# Patient Record
Sex: Female | Born: 1970 | Race: White | Hispanic: No | Marital: Married | Smoking: Never smoker
Health system: Southern US, Community
[De-identification: ages and names within clinical notes are randomized; demographics above are authoritative.]

## PROBLEM LIST (undated history)

## (undated) DIAGNOSIS — M479 Spondylosis, unspecified: Secondary | ICD-10-CM

## (undated) DIAGNOSIS — D259 Leiomyoma of uterus, unspecified: Secondary | ICD-10-CM

## (undated) DIAGNOSIS — I10 Essential (primary) hypertension: Secondary | ICD-10-CM

## (undated) DIAGNOSIS — D509 Iron deficiency anemia, unspecified: Secondary | ICD-10-CM

## (undated) DIAGNOSIS — O3410 Maternal care for benign tumor of corpus uteri, unspecified trimester: Secondary | ICD-10-CM

## (undated) DIAGNOSIS — E785 Hyperlipidemia, unspecified: Secondary | ICD-10-CM

## (undated) HISTORY — PX: TUBAL LIGATION: SHX77

---

## 2006-07-28 ENCOUNTER — Ambulatory Visit: Payer: Self-pay | Admitting: Cardiology

## 2006-08-06 ENCOUNTER — Ambulatory Visit: Payer: Self-pay | Admitting: Cardiology

## 2006-08-31 ENCOUNTER — Ambulatory Visit: Payer: Self-pay | Admitting: Cardiology

## 2009-09-28 ENCOUNTER — Emergency Department (HOSPITAL_COMMUNITY): Admission: EM | Admit: 2009-09-28 | Discharge: 2009-09-28 | Payer: Self-pay | Admitting: Emergency Medicine

## 2011-01-17 NOTE — Assessment & Plan Note (Signed)
[facility]                          [name] NOTE   WAVE, CALZADA                        MRN:          [id number]  DATE:07/28/2006                            DOB:          1971-08-31    REFERRING PHYSICIAN:  Doreen Beam   PRIMARY CARDIOLOGIST:  Learta Codding, M.D. (new).   REASON FOR CONSULTATION:  Chest pain.   HISTORY OF PRESENT ILLNESS:  Ms. Verdell is a pleasant 40 year old  female, with no prior cardiac history, now referred for evaluation of  new onset chest pain.  The patient does have cardiac risk factors  notable for hypertension, initially diagnosed approximately three years  ago, for which she has only been on medication in the last year,  reported hyperlipidemia and a strong family history of premature  coronary artery disease.  The patient has no known history of diabetes  mellitus and has never smoked tobacco.   The patient had a recent 2-D echocardiogram which revealed normal left  ventricular function (ejection fraction of 60%), with mild left  ventricular dysfunction and mild mitral regurgitation.  No wall motion  abnormalities noted.   From a clinical standpoint, the patient describes intermittent chest  pains which have developed this year, which are quite unpredictable in  onset.  In fact, they are typically not related to activity and usually  occur while sitting watching TV or while lying in bed at night.  She  also reports having symptoms suggestive of reflux disease, for which she  is currently not on a medication.   These pains are described alternately as pins and needles, as well as  occasionally a heaviness sensation. They can last anywhere from a few  minutes to several hours in duration.  They are not exacerbated by deep  inspiration, movement of the chest or arms or with walking.  Of note,  she reports being able to climb a stairs with some associated dyspnea,  but no associated chest  discomfort.   An electrocardiogram today reveals a normal sinus rhythm at 92 b.p.m.  with normal axis, small Q-waves in the inferior lateral leads with  otherwise no ischemic changes.   ALLERGIES:  PENICILLIN.   CURRENT MEDICATIONS:  Lisinopril 20 mg q.d.   PAST MEDICAL HISTORY:  1. Hypertension.  2. Hyperlipidemia.  3. Obesity.  4. Status post tubal ligation.   SOCIAL HISTORY:  The patient is married.  They have an 53-year-old  daughter.  She has never smoked tobacco and denies alcohol or illicit  drug use.   FAMILY HISTORY:  Father age 68, status post myocardial infarction  approximately two years ago.  Mother age 48, status post recent  myocardial infarction.   REVIEW OF SYSTEMS:  Notable for occasional reflux.  A non-productive  cough.  Occasional dyspnea.  No PND, orthopnea or lower extremity edema.  Otherwise as noted per the HPI.  The remaining systems negative.   PHYSICAL EXAMINATION:  VITAL SIGNS:  Blood pressure 148/90, pulse 92 and  regular, weight 209 pounds.  GENERAL:  A 40 year old female, obese, sitting upright, in no apparent  distress.  HEENT:  Normocephalic and atraumatic.  NECK:  Palpable bilateral carotid pulses without bruits.  No jugular  venous distention at 90 degrees.  LUNGS:  Clear to auscultation in all fields.  HEART:  A regular rate and rhythm.  S1 and S2.  A soft grade 1-2/6 short  systolic ejection murmur heard loudest at the base.  No diastolic blow.  ABDOMEN:  Protuberant, nontender, with intact bowel sounds.  No bruits.  EXTREMITIES:  All palpable distal pulses with no pedal edema.  NEUROLOGIC:  No focal deficit.  BREASTS/GENITOURINARY/RECTAL:  Deferred.  NODES:  No lymphadenopathy.   IMPRESSION:  1. Atypical chest pain.  2. Multiple cardiac risk factors      a.     Hypertension.      b.     History of hyperlipidemia.      c.     Strong family history of premature coronary artery disease.      d.     Obesity.   PLAN:  I conferred  with Dr. Learta Codding and the recommendation is to  proceed with further evaluation with an exercise stress echocardiogram  test.  The patient presents with chest pain which is quite atypical, but  she does have several cardiac risk factors.  At the time of the  scheduled stress test, we will also check a fasting lipid profile for  reassessment of her lipid status.   Following completion of the study, we will have the patient return to  the clinic for a review of her test results and further recommendations.      Gene Serpe, PA-C  Electronically Signed      Learta Codding, MD,FACC  Electronically Signed   GS/MedQ  DD: 07/28/2006  DT: 07/28/2006  Job #: [id number]

## 2011-01-17 NOTE — Assessment & Plan Note (Signed)
[facility]                          [name]   Abigail Burgess, Abigail Burgess                        MRN:          [id number]  DATE:08/31/2006                            DOB:          1971-07-26    PRIMARY CARDIOLOGIST:  Dr. Lewayne Bunting.   REASON FOR OFFICE VISIT:  Scheduled 32-month followup.   The patient returns to discuss the results of a recent exercise stress  echocardiogram for further evaluation of chest pain, which was felt to  be atypical.  However, the patient was referred to [place] in the context of  multiple cardiac risk factors.   The patient completed an exercise stress echocardiogram on December 6,  during which he exercised for a little over 5 minutes, achieving 104% of  PMHR and 6.4 METS.  She had no chest pain, and there were no significant  EKG changes.  Echocardiography revealed normal hyperdynamic contracture  with exercise with no wall motion abnormalities.   Clinically, the patient reports no further chest pain.   We also discussed the results of a lipid profile, which we ordered.  This revealed a total cholesterol of 222 with an HDL of 53,  triglycerides of 58, and an LDL of 157.   CURRENT MEDICATIONS:  Lisinopril 20 daily.   PHYSICAL EXAMINATION:  Blood pressure 140/90, pulse 80 and regular,  weight 208.  GENERAL:  A 40 year old female sitting upright in no distress.  NECK:  Palpable carotid pulses; no JVD.  LUNGS:  Clear to auscultation.  HEART:  Regular rate and rhythm (S1, S2).  No significant murmurs.  EXTREMITIES:  No edema.  NEURO:  No focal deficits.   IMPRESSION:  Abigail Burgess is a 40 year old female who is recently  referred to [place] for evaluation of atypical chest pain in the setting of  multiple cardiac risk factors, and who had a normal exercise stress  echocardiogram earlier this month.  She is currently having no further  symptoms and no further cardiac workup is, therefore, recommended at  this  point in time.   The patient is to continue on current medication regimen.  We also  discussed the importance of diet/exercise as initial treatment of her  hyperlipidemia.  She should have a repeat lipid profile in 6 months, and  if total cholesterol is still elevated, she is to discuss whether or not  to initiate medical therapy with Dr. Sherril Croon.   The patient will otherwise return to Dr. Lewayne Bunting on an as needed  basis.      Gene Serpe, PA-C  Electronically Signed      Learta Codding, MD,FACC  Electronically Signed   GS/MedQ  DD: 08/31/2006  DT: 08/31/2006  Job #: [id number]   cc:   Doreen Beam

## 2015-07-03 DIAGNOSIS — I10 Essential (primary) hypertension: Secondary | ICD-10-CM

## 2015-07-03 DIAGNOSIS — M479 Spondylosis, unspecified: Secondary | ICD-10-CM

## 2015-07-03 DIAGNOSIS — E785 Hyperlipidemia, unspecified: Secondary | ICD-10-CM

## 2015-07-03 HISTORY — DX: Hyperlipidemia, unspecified: E78.5

## 2015-07-03 HISTORY — DX: Spondylosis, unspecified: M47.9

## 2015-07-03 HISTORY — DX: Essential (primary) hypertension: I10

## 2015-07-19 ENCOUNTER — Telehealth: Payer: Self-pay | Admitting: Internal Medicine

## 2015-07-19 NOTE — Telephone Encounter (Signed)
Transfer from [facility] per EDP,  Dr. Jake Samples  44 year old lady with past medical history of hypertension and allergy, who presented to the [facility] emergency room with intractable nausea, vomiting and diarrhea for 7 hours. Patient vomited more than 10 times and had watery diarrhea for more than 15 times. No hematochezia or hematemesis. No abdominal pain. No recent antibiotics use. Her nausea and vomiting have improved after treated with Zofran 2, Phenergan 1 and reglan x 1 in ED. Patient received 2 L normal saline bolus and followed by 100 mL of normal saline. Rocephin was started for possible UTI  Patient was found to have positive urinalysis with trace amount of leukocyte and large amount of RBC and also some trichomonas. Electrolytes and renal function okay except for bicarbonate 20.5, liver function normal, lipase 14, WBC 6.4, hemoglobin 12.3, platelets 388. CT head is negative for acute intracranial abnormalities. Blood pressure 144/83, temperature normal, heart rate at ~90s and RR 20.   Family stronely requested patient being transferred to [facility] for further evaluation and treatment. Accepted to Tele bed.  Ivor Costa, MD  [facility] Pager [phone]  If 7PM-7AM, please contact night-coverage [email or web address] Password [facility] 07/19/2015, 7:56 PM

## 2015-07-20 ENCOUNTER — Observation Stay (HOSPITAL_BASED_OUTPATIENT_CLINIC_OR_DEPARTMENT_OTHER): Payer: Medicaid Other

## 2015-07-20 ENCOUNTER — Inpatient Hospital Stay (HOSPITAL_COMMUNITY)
Admission: AD | Admit: 2015-07-20 | Discharge: 2015-07-27 | DRG: 392 | Disposition: A | Discharge status: Home / Self Care | Payer: Medicaid Other | Source: Other Acute Inpatient Hospital | Attending: Internal Medicine | Admitting: Internal Medicine

## 2015-07-20 DIAGNOSIS — E876 Hypokalemia: Secondary | ICD-10-CM | POA: Diagnosis present

## 2015-07-20 DIAGNOSIS — D259 Leiomyoma of uterus, unspecified: Secondary | ICD-10-CM | POA: Diagnosis present

## 2015-07-20 DIAGNOSIS — K529 Noninfective gastroenteritis and colitis, unspecified: Secondary | ICD-10-CM | POA: Diagnosis not present

## 2015-07-20 DIAGNOSIS — E079 Disorder of thyroid, unspecified: Secondary | ICD-10-CM | POA: Diagnosis present

## 2015-07-20 DIAGNOSIS — R011 Cardiac murmur, unspecified: Secondary | ICD-10-CM

## 2015-07-20 DIAGNOSIS — R748 Abnormal levels of other serum enzymes: Secondary | ICD-10-CM | POA: Diagnosis present

## 2015-07-20 DIAGNOSIS — E785 Hyperlipidemia, unspecified: Secondary | ICD-10-CM | POA: Diagnosis present

## 2015-07-20 DIAGNOSIS — A5901 Trichomonal vulvovaginitis: Secondary | ICD-10-CM | POA: Diagnosis present

## 2015-07-20 DIAGNOSIS — A084 Viral intestinal infection, unspecified: Principal | ICD-10-CM | POA: Diagnosis present

## 2015-07-20 DIAGNOSIS — R1110 Vomiting, unspecified: Secondary | ICD-10-CM | POA: Insufficient documentation

## 2015-07-20 DIAGNOSIS — E860 Dehydration: Secondary | ICD-10-CM | POA: Diagnosis present

## 2015-07-20 DIAGNOSIS — M545 Low back pain: Secondary | ICD-10-CM | POA: Diagnosis present

## 2015-07-20 DIAGNOSIS — A09 Infectious gastroenteritis and colitis, unspecified: Secondary | ICD-10-CM | POA: Insufficient documentation

## 2015-07-20 DIAGNOSIS — R000 Tachycardia, unspecified: Secondary | ICD-10-CM | POA: Diagnosis present

## 2015-07-20 DIAGNOSIS — I10 Essential (primary) hypertension: Secondary | ICD-10-CM | POA: Diagnosis present

## 2015-07-20 DIAGNOSIS — A599 Trichomoniasis, unspecified: Secondary | ICD-10-CM | POA: Diagnosis present

## 2015-07-20 DIAGNOSIS — M5489 Other dorsalgia: Secondary | ICD-10-CM | POA: Diagnosis not present

## 2015-07-20 DIAGNOSIS — R109 Unspecified abdominal pain: Secondary | ICD-10-CM | POA: Insufficient documentation

## 2015-07-20 DIAGNOSIS — R1084 Generalized abdominal pain: Secondary | ICD-10-CM | POA: Diagnosis not present

## 2015-07-20 DIAGNOSIS — M549 Dorsalgia, unspecified: Secondary | ICD-10-CM

## 2015-07-20 HISTORY — DX: Hyperlipidemia, unspecified: E78.5

## 2015-07-20 HISTORY — DX: Iron deficiency anemia, unspecified: D50.9

## 2015-07-20 HISTORY — DX: Essential (primary) hypertension: I10

## 2015-07-20 HISTORY — DX: Maternal care for benign tumor of corpus uteri, unspecified trimester: O34.10

## 2015-07-20 HISTORY — DX: Spondylosis, unspecified: M47.9

## 2015-07-20 HISTORY — DX: Leiomyoma of uterus, unspecified: D25.9

## 2015-07-20 LAB — COMPREHENSIVE METABOLIC PANEL
ALT: 12 U/L — ABNORMAL LOW (ref 14–54)
ANION GAP: 6 (ref 5–15)
AST: 18 U/L (ref 15–41)
Albumin: 2.8 g/dL — ABNORMAL LOW (ref 3.5–5.0)
Alkaline Phosphatase: 78 U/L (ref 38–126)
BUN: <5 mg/dL — ABNORMAL LOW (ref 6–20)
CHLORIDE: 107 mmol/L (ref 101–111)
CO2: 25 mmol/L (ref 22–32)
CREATININE: 0.7 mg/dL (ref 0.44–1.00)
Calcium: 8.1 mg/dL — ABNORMAL LOW (ref 8.9–10.3)
Glucose, Bld: 117 mg/dL — ABNORMAL HIGH (ref 65–99)
POTASSIUM: 2.8 mmol/L — AB (ref 3.5–5.1)
SODIUM: 138 mmol/L (ref 135–145)
Total Bilirubin: 0.4 mg/dL (ref 0.3–1.2)
Total Protein: 6 g/dL — ABNORMAL LOW (ref 6.5–8.1)

## 2015-07-20 LAB — CBC
HEMATOCRIT: 32.8 % — AB (ref 36.0–46.0)
HEMOGLOBIN: 10.6 g/dL — AB (ref 12.0–15.0)
MCH: 25.2 pg — ABNORMAL LOW (ref 26.0–34.0)
MCHC: 32.3 g/dL (ref 30.0–36.0)
MCV: 77.9 fL — AB (ref 78.0–100.0)
PLATELETS: 317 10*3/uL (ref 150–400)
RBC: 4.21 MIL/uL (ref 3.87–5.11)
RDW: 14.6 % (ref 11.5–15.5)
WBC: 4.7 10*3/uL (ref 4.0–10.5)

## 2015-07-20 MED ORDER — CIPROFLOXACIN IN D5W 400 MG/200ML IV SOLN
400.0000 mg | Freq: Two times a day (BID) | INTRAVENOUS | Status: DC
  Administered 2015-07-20: 400 mg via INTRAVENOUS
  Filled 2015-07-20: qty 200

## 2015-07-20 MED ORDER — HYDROCHLOROTHIAZIDE 25 MG PO TABS: 25.0000 mg | ORAL_TABLET | Freq: Every day | ORAL | Status: DC

## 2015-07-20 MED ORDER — DEXTROSE 5 % IV SOLN: 1.0000 g | INTRAVENOUS | Status: DC

## 2015-07-20 MED ORDER — SODIUM CHLORIDE 0.9 % IV BOLUS (SEPSIS)
1000.0000 mL | Freq: Once | INTRAVENOUS | Status: AC
  Administered 2015-07-20: 1000 mL via INTRAVENOUS

## 2015-07-20 MED ORDER — ONDANSETRON HCL 4 MG PO TABS: 4.0000 mg | ORAL_TABLET | Freq: Four times a day (QID) | ORAL | Status: DC | PRN

## 2015-07-20 MED ORDER — LISINOPRIL 40 MG PO TABS: 40.0000 mg | ORAL_TABLET | Freq: Every day | ORAL | Status: DC

## 2015-07-20 MED ORDER — TINIDAZOLE 500 MG PO TABS: 2.0000 g | ORAL_TABLET | Freq: Once | ORAL | Status: AC

## 2015-07-20 MED ORDER — POTASSIUM CHLORIDE IN NACL 20-0.9 MEQ/L-% IV SOLN
INTRAVENOUS | Status: DC
  Administered 2015-07-20 – 2015-07-27 (×17): via INTRAVENOUS
  Filled 2015-07-20 (×32): qty 1000

## 2015-07-20 MED ORDER — PROMETHAZINE HCL 25 MG/ML IJ SOLN
25.0000 mg | Freq: Four times a day (QID) | INTRAMUSCULAR | Status: DC | PRN
  Administered 2015-07-20 – 2015-07-25 (×11): 25 mg via INTRAVENOUS
  Filled 2015-07-20 (×11): qty 1

## 2015-07-20 MED ORDER — GI COCKTAIL ~~LOC~~
30.0000 mL | Freq: Once | ORAL | Status: AC
  Administered 2015-07-20: 30 mL via ORAL
  Filled 2015-07-20: qty 30

## 2015-07-20 MED ORDER — HYDRALAZINE HCL 20 MG/ML IJ SOLN
10.0000 mg | Freq: Four times a day (QID) | INTRAMUSCULAR | Status: DC | PRN
  Administered 2015-07-20 – 2015-07-21 (×3): 10 mg via INTRAVENOUS
  Filled 2015-07-20 (×3): qty 1

## 2015-07-20 MED ORDER — LISINOPRIL 40 MG PO TABS
40.0000 mg | ORAL_TABLET | Freq: Every day | ORAL | Status: DC
  Administered 2015-07-22 – 2015-07-27 (×6): 40 mg via ORAL
  Filled 2015-07-20 (×6): qty 1

## 2015-07-20 MED ORDER — METRONIDAZOLE IN NACL 5-0.79 MG/ML-% IV SOLN
500.0000 mg | Freq: Three times a day (TID) | INTRAVENOUS | Status: DC
  Administered 2015-07-20 (×2): 500 mg via INTRAVENOUS
  Filled 2015-07-20 (×2): qty 100

## 2015-07-20 MED ORDER — MAGNESIUM SULFATE 2 GM/50ML IV SOLN
2.0000 g | Freq: Once | INTRAVENOUS | Status: AC
  Administered 2015-07-20: 2 g via INTRAVENOUS
  Filled 2015-07-20: qty 50

## 2015-07-20 MED ORDER — POTASSIUM CHLORIDE CRYS ER 20 MEQ PO TBCR
40.0000 meq | EXTENDED_RELEASE_TABLET | ORAL | Status: AC
  Administered 2015-07-20 (×2): 40 meq via ORAL
  Filled 2015-07-20 (×2): qty 2

## 2015-07-20 MED ORDER — HYDRALAZINE HCL 20 MG/ML IJ SOLN
10.0000 mg | Freq: Once | INTRAMUSCULAR | Status: AC
  Administered 2015-07-20: 10 mg via INTRAVENOUS
  Filled 2015-07-20: qty 1

## 2015-07-20 MED ORDER — PANTOPRAZOLE SODIUM 40 MG IV SOLR
40.0000 mg | Freq: Once | INTRAVENOUS | Status: AC
  Administered 2015-07-20: 40 mg via INTRAVENOUS
  Filled 2015-07-20: qty 40

## 2015-07-20 MED ORDER — ONDANSETRON HCL 4 MG/2ML IJ SOLN
4.0000 mg | Freq: Four times a day (QID) | INTRAMUSCULAR | Status: DC | PRN
  Administered 2015-07-20 – 2015-07-25 (×10): 4 mg via INTRAVENOUS
  Filled 2015-07-20 (×11): qty 2

## 2015-07-20 NOTE — Care Management Note (Signed)
Case Management Note  Patient Details  Name: Abigail Burgess MRN: [id number] Date of Birth: 29-Jan-1971  Subjective/Objective:                  Date-07-20-15 Initial Assessment Spoke with patient at the bedside Introduced self as case manager and explained role in discharge planning and how to be reached.  Verified patient lives with boyfriend in [place] Verified patient anticipates to go home with boyfriend.  Patient has no DME. Expressed potential need for no other DME.  Patient denied  needing help with their medication.  Patient drives to MD appointments.  Verified patient has PCP   Plan: CM will continue to follow for discharge planning and [facility] resources.   Carles Collet RN BSN CM [phone]   Action/Plan:   Expected Discharge Date:                  Expected Discharge Plan:  Home/Self Care  In-House Referral:     Discharge planning Services  CM Consult  Post Acute Care Choice:    Choice offered to:     DME Arranged:    DME Agency:     HH Arranged:    [place] Agency:     Status of Service:  Completed, signed off  Medicare Important Message Given:    Date Medicare IM Given:    Medicare IM give by:    Date Additional Medicare IM Given:    Additional Medicare Important Message give by:     If discussed at [place] of Stay Meetings, dates discussed:    Additional Comments:  Carles Collet, RN 07/20/2015, 11:56 AM

## 2015-07-20 NOTE — Progress Notes (Signed)
Pharmacist Provided - Patient Medication Education Prior to Discharge   Abigail Burgess is an 44 y.o. female who presented to [facility] on 07/20/2015 with a chief complaint of No chief complaint on file.    [x]  Patient will be discharged with new medications []  Patient being discharged without any new medications  The following medications were discussed with the patient:   Pain Control medications: []  Yes    [x]  No  Diabetes Medications: []  Yes    [x]  No  Heart Failure Medications: []  Yes    [x]  No  Blood pressure Medications:  [x]  Yes    []  No  Antibiotics at discharge: [x]  Yes    []  No  Allergy Assessment Completed and Updated: [x]  Yes    []  No Identified Patient Allergies:  Allergies  Allergen Reactions  . Bee Pollen Anaphylaxis  . Ciprofloxacin Hives     Medication Adherence Assessment: []  Excellent (no doses missed/week)      [x]  Good (1 dose missed/week)      []  Partial (2-3 doses missed/week)      []  Poor (>3 doses missed/week)  Barriers to Obtaining Medications: []  Yes [x]  No  Abigail Burgess expressed no concern with obtaining medication.  Assessment: 44 y/o F admitted with vomiting and diarrhea. Patient will be going home on new medications HCTZ and tinidazole. Counseled patient on new blood pressure medications, side effects, and appropriate use. Pt has a blood pressure cuff at home and endorses regularly checking blood pressure. Also counseled patient on one time dose of tinidazole. Pt verbalized understanding of treatment plan.   Time spent preparing for discharge counseling: 10 min Time spent counseling patient: 10 min  Angela Burke, PharmD Pharmacy Resident Pager: [phone] 07/20/2015, 3:18 PM

## 2015-07-20 NOTE — H&P (Signed)
[facility] History and Physical  BRYTTNEY AUGUST [id number] DOB: 1971-08-23 DOA: 07/20/2015  Referring physician: Dr. Jake Samples. PCP: Glenda Chroman., MD   Chief Complaint: Nausea, vomiting and diarrhea.  HPI: Abigail Burgess is a 44 y.o. female with a past medical history of hypertension, hyperlipidemia, unspecified thyroid disease who was transferred to this facility after presenting to [facility] with complaints of having emesis more than 10 times and mucosa watery stools more than 15 times since earlier in the day. Per patient, her last meal was a combo meal from [employer] Wednesday evening. She states that she went to bed early at around 9 like usual and did not have any symptoms until the morning. She denies melena or hematochezia. She denies urinary symptoms. She says that to her knowledge, there are no other persons with similar symptoms.  [facility] significant for decreased CO2 at 20.5 and an elevated anion gap at 21. Her UA shows mild pyuria, bacteriuria and Trichomonas. I was unable to discuss this last issue with the patient due to her persistent somnolence. However the patient stated the she was in no distress, feeling better, that she was tired and used to go to bed early in the evening.    Review of Systems:  Constitutional:  Positive chills, fatigue.  No weight loss, night sweats, Fevers,  HEENT:  No headaches, Difficulty swallowing,Tooth/dental problems,Sore throat,  No sneezing, itching, ear ache, nasal congestion, post nasal drip,  Cardio-vascular:  Frequent lower extremity pitting edema and palpitations. No chest pain, Orthopnea, PND, anasarca, dizziness. GI:  Positive abdominal pain, nausea and emesis and diarrhea. No melena, no hematochezia. Resp:  No dyspnea, productive cough, hemoptysis or wheezing. Skin:  no rash or lesions.  GU:  no dysuria, change in color of urine, no urgency or frequency. No flank pain.  Musculoskeletal:  No  joint pain or swelling. No decreased range of motion. No back pain.  Psych:  No change in mood or affect. No depression or anxiety. No memory loss.   No past medical history on file. No past surgical history on file. Social History:  has no tobacco, alcohol, and drug history on file.  Allergies not on file  No family history on file.   Prior to Admission medications   Not on File   Physical Exam: Filed Vitals:   07/20/15 0039 07/20/15 0043 07/20/15 0216  BP:  145/84   Pulse:  109   Temp:  98.6 F (37 C)   TempSrc:  Oral   Resp:  24   Height: 5' (1.524 m)    Weight: 89.086 kg (196 lb 6.4 oz)    SpO2:  99% 99%    Wt Readings from Last 3 Encounters:  07/20/15 89.086 kg (196 lb 6.4 oz)    General:  Appears calm and comfortable Eyes: PERRL, normal lids, irises & conjunctiva ENT: grossly normal hearing, lips and oral mucosa are dry. Neck: no LAD, masses or thyromegaly Cardiovascular: Tachycardic and positive systolic murmur. No LE edema. Telemetry: Tachycardic  Respiratory: CTA bilaterally, no w/r/r. Normal respiratory effort. Abdomen: Bowel sounds hyperactive, soft, positive left lower quadrant and epigastric tenderness. There is no guarding or rebound tenderness. Skin: no rash or induration seen on limited exam Musculoskeletal: grossly normal tone BUE/BLE Psychiatric: Somnolent, but arousable and answers questions properly. Neurologic: grossly non-focal.           EKG: Independently reviewed.  Sinus tachycardia at 111 bpm Probable left heart enlargement Nonspecific T wave abnormality Prolonged QT.  Assessment/Plan Principal Problem:   Acute gastroenteritis Continue IV hydration and IV antibiotics. Treat abdominal pain, nausea and emesis symptoms as needed.  Active Problems:   Tachycardia Continue IV hydration, recheck potassium and check magnesium level.    Heart murmur Per patient, she gets frequent lower extremity edema. Check echocardiogram.     Trichomonas infection Unable to discuss this issue with the patient due to her tiredness, fatigue and lack of privacy.  Continue IV Flagyl.    Dehydration Continue IV hydration.    Essential hypertension Resume lisinopril once the patient is tolerating oral intake.    Hyperlipidemia Not currently on medication.    Code Status: Full code. DVT Prophylaxis: SCDs. Family Communication: Disposition Plan: Admit to telemetry and continue IV hydration/IV antibiotics.  Time spent: Over 70 minutes were spent during the process of this admission.  Reubin Milan [facility] Pager [phone].

## 2015-07-20 NOTE — Discharge Summary (Signed)
Physician Discharge Summary  TOPAZ ELDRETH [id number] DOB: 08/18/1971 DOA: 07/20/2015  PCP: Glenda Chroman., MD  Admit date: 07/20/2015 Discharge date: 07/20/2015  Time spent: 25 minutes  Recommendations for Outpatient Follow-up:  1. Discharged home with outpatient PCP follow-up   Discharge Diagnoses:  Principal Problem:   Acute gastroenteritis  Active Problems:   Tachycardia   Heart murmur   Trichomonas infection   Dehydration   Essential hypertension   Hyperlipidemia   Hypokalemia   Discharge Condition: Fair  Diet recommendation: Low sodium  Filed Weights   07/20/15 0039  Weight: 89.086 kg (196 lb 6.4 oz)    History of present illness:  Please refer to admission H&P for details, in brief, 44 year old female with history of hypertension, hyperlipidemia, presented to [facility] hospital after having >10 episodes of vomiting with watery stool  on the morning of 11/17. She reports having abnormal meal at [employer] on the evening prior. She denies any fevers, chills, headache, dizziness, chest pain, palpitations, shortness of breath, abdominal pain, hematemesis or melena. Denied any urinary symptoms. Workup at [facility] showed mild metabolic acidosis with anion gap of 17. UA showed bacteria and trichomonas. Patient reportedly was sleepy and lethargic at [facility]. Patient was given several rounds of antiemetics and IV fluids. Vitals were stable except for being tachycardic. She had normal white counts and lactic acid was normal. As per Patient and family request she was transferred to [facility].  Hospital Course:  Acute gastroenteritis Possibly associated with food poisoning. Symptoms resolved with IV hydration, antiemetics. Patient was placed on empiric Cipro and Flagyl. Diet advanced to regular and patient tolerating well.  Tachycardia Associated with severe dehydration. Asymptomatic. 2-D echo done with EF of 55 and 62% and grade 1 diastolic  dysfunction. No valvular abnormality noted.  Hypokalemia Replenish with by mouth and IV KCl. Will also order 2g IV magnesium sulfate.  Trichomonas vaginitis Patient reports having multiple partners over the past 2 years. Denies any urinary symptoms. Reports having a single partner for the past 1 year . Will treat her with a single dose of Tinizadole 2 gm.  Have instructed to have a partner checked for trichomonas, STD and HIV.  Essential hypertension Resume lisinopril. Have instructed to hold HCTZ for next 2 days given severe hypokalemia.  Patient is clinically stable and can be discharged home.   Procedures:  NONE  Consultations:  none  Discharge Exam: Filed Vitals:   07/20/15 0507  BP: 155/86  Pulse: 121  Temp: 99.4 F (37.4 C)  Resp: 16    General: Middle aged female not in distress HEENT: No pallor, moist oral mucosa  chest: Clear to auscultation bilaterally CVS: S1 and S2 tachycardic, no murmurs or gallop GI: Soft, nondistended, nontender Musculoskeletal: Warm, no edema  Discharge Instructions    Current Discharge Medication List    START taking these medications   Details  hydrochlorothiazide (HYDRODIURIL) 25 MG tablet Take 1 tablet (25 mg total) by mouth daily. Qty: 10 tablet, Refills: 0    lisinopril (PRINIVIL,ZESTRIL) 40 MG tablet Take 1 tablet (40 mg total) by mouth daily. Qty: 10 tablet, Refills: 0    tinidazole (TINDAMAX) 500 MG tablet Take 4 tablets (2,000 mg total) by mouth once. Qty: 4 tablet, Refills: 0       Allergies  Allergen Reactions  . Bee Pollen Swelling  . Ciprofloxacin Hives   Follow-up Information    Follow up with [name],[name], MD. Schedule an appointment as soon as possible for a visit in  2 weeks.   Specialty:  Internal Medicine   Contact information:   [address] [place] [phone]        The results of significant diagnostics from this hospitalization (including imaging, microbiology, ancillary and  laboratory) are listed below for reference.    Significant Diagnostic Studies: No results found.  Microbiology: No results found for this or any previous visit (from the past 240 hour(s)).   Labs: Basic Metabolic Panel:  Recent Labs Lab 07/20/15 0807  NA 138  K 2.8*  CL 107  CO2 25  GLUCOSE 117*  BUN <5*  CREATININE 0.70  CALCIUM 8.1*   Liver Function Tests:  Recent Labs Lab 07/20/15 0807  AST 18  ALT 12*  ALKPHOS 78  BILITOT 0.4  PROT 6.0*  ALBUMIN 2.8*   No results for input(s): LIPASE, AMYLASE in the last 168 hours. No results for input(s): AMMONIA in the last 168 hours. CBC:  Recent Labs Lab 07/20/15 0807  WBC 4.7  HGB 10.6*  HCT 32.8*  MCV 77.9*  PLT 317   Cardiac Enzymes: No results for input(s): CKTOTAL, CKMB, CKMBINDEX, TROPONINI in the last 168 hours. BNP: BNP (last 3 results) No results for input(s): BNP in the last 8760 hours.  ProBNP (last 3 results) No results for input(s): PROBNP in the last 8760 hours.  CBG: No results for input(s): GLUCAP in the last 168 hours.     SignedLouellen Molder  [facility] 07/20/2015, 10:14 AM

## 2015-07-20 NOTE — Progress Notes (Signed)
  Echocardiogram 2D Echocardiogram has been performed.  Abigail Burgess 07/20/2015, 9:51 AM

## 2015-07-21 LAB — COMPREHENSIVE METABOLIC PANEL
ALT: 13 U/L — AB (ref 14–54)
AST: 19 U/L (ref 15–41)
Albumin: 3.4 g/dL — ABNORMAL LOW (ref 3.5–5.0)
Alkaline Phosphatase: 92 U/L (ref 38–126)
Anion gap: 10 (ref 5–15)
BUN: <5 mg/dL — ABNORMAL LOW (ref 6–20)
CHLORIDE: 107 mmol/L (ref 101–111)
CO2: 22 mmol/L (ref 22–32)
Calcium: 8.8 mg/dL — ABNORMAL LOW (ref 8.9–10.3)
Creatinine, Ser: 0.77 mg/dL (ref 0.44–1.00)
Glucose, Bld: 122 mg/dL — ABNORMAL HIGH (ref 65–99)
POTASSIUM: 3.6 mmol/L (ref 3.5–5.1)
SODIUM: 139 mmol/L (ref 135–145)
Total Bilirubin: 0.5 mg/dL (ref 0.3–1.2)
Total Protein: 6.9 g/dL (ref 6.5–8.1)

## 2015-07-21 LAB — CBC WITH DIFFERENTIAL/PLATELET
BASOS PCT: 0 %
Basophils Absolute: 0 10*3/uL (ref 0.0–0.1)
EOS PCT: 0 %
Eosinophils Absolute: 0 10*3/uL (ref 0.0–0.7)
HEMATOCRIT: 37.6 % (ref 36.0–46.0)
HEMOGLOBIN: 12.7 g/dL (ref 12.0–15.0)
Lymphocytes Relative: 13 %
Lymphs Abs: 1.1 10*3/uL (ref 0.7–4.0)
MCH: 26.1 pg (ref 26.0–34.0)
MCHC: 33.8 g/dL (ref 30.0–36.0)
MCV: 77.4 fL — AB (ref 78.0–100.0)
MONOS PCT: 5 %
Monocytes Absolute: 0.4 10*3/uL (ref 0.1–1.0)
NEUTROS PCT: 82 %
Neutro Abs: 6.7 10*3/uL (ref 1.7–7.7)
PLATELETS: 377 10*3/uL (ref 150–400)
RBC: 4.86 MIL/uL (ref 3.87–5.11)
RDW: 14.8 % (ref 11.5–15.5)
WBC: 8.2 10*3/uL (ref 4.0–10.5)

## 2015-07-21 MED ORDER — LORAZEPAM 2 MG/ML IJ SOLN
1.0000 mg | Freq: Once | INTRAMUSCULAR | Status: AC
  Administered 2015-07-21: 1 mg via INTRAVENOUS
  Filled 2015-07-21: qty 1

## 2015-07-21 MED ORDER — GI COCKTAIL ~~LOC~~
30.0000 mL | Freq: Three times a day (TID) | ORAL | Status: DC | PRN
  Administered 2015-07-23: 30 mL via ORAL
  Filled 2015-07-21 (×2): qty 30

## 2015-07-21 MED ORDER — MORPHINE SULFATE (PF) 2 MG/ML IV SOLN
2.0000 mg | INTRAVENOUS | Status: DC | PRN
  Administered 2015-07-21 – 2015-07-25 (×12): 2 mg via INTRAVENOUS
  Filled 2015-07-21 (×12): qty 1

## 2015-07-21 MED ORDER — HYDRALAZINE HCL 20 MG/ML IJ SOLN
10.0000 mg | INTRAMUSCULAR | Status: DC | PRN
  Administered 2015-07-21 – 2015-07-24 (×6): 10 mg via INTRAVENOUS
  Filled 2015-07-21 (×7): qty 1

## 2015-07-21 MED ORDER — OXYCODONE HCL 5 MG PO TABS
5.0000 mg | ORAL_TABLET | ORAL | Status: DC | PRN
  Administered 2015-07-21 – 2015-07-22 (×2): 5 mg via ORAL
  Filled 2015-07-21 (×2): qty 1

## 2015-07-21 MED ORDER — TRAMADOL HCL 50 MG PO TABS: 50.0000 mg | ORAL_TABLET | Freq: Four times a day (QID) | ORAL | Status: DC | PRN

## 2015-07-21 MED ORDER — METOCLOPRAMIDE HCL 5 MG/ML IJ SOLN
10.0000 mg | Freq: Four times a day (QID) | INTRAMUSCULAR | Status: DC
  Administered 2015-07-21 – 2015-07-27 (×21): 10 mg via INTRAVENOUS
  Filled 2015-07-21 (×23): qty 2

## 2015-07-21 MED ORDER — FAMOTIDINE IN NACL 20-0.9 MG/50ML-% IV SOLN
20.0000 mg | Freq: Two times a day (BID) | INTRAVENOUS | Status: DC
  Administered 2015-07-21 – 2015-07-23 (×5): 20 mg via INTRAVENOUS
  Filled 2015-07-21 (×8): qty 50

## 2015-07-21 NOTE — Progress Notes (Signed)
[facility] PROGRESS NOTE  Abigail Burgess [id number] DOB: 1971/05/09 DOA: 07/20/2015 PCP: Glenda Chroman., MD  Assessment/Plan: Acute gastroenteritis Possibly associated with food poisoning.  Symptoms had resolved was plan to be discharged home yesterday but again reoccurred several episodes of vomiting and nonbloody diarrhea. Will check stool for GI pathogen panel. Strongly suspect C. difficile. We'll place her on scheduled Reglan with when necessary Phenergan. Continue IV hydration.  Tachycardia Associated with severe dehydration. Asymptomatic. 2-D echo done with EF of 55 and 62% and grade 1 diastolic dysfunction. No valvular abnormality noted.  Hypokalemia Replenished.  Trichomonas vaginitis Patient reports having multiple partners over the past 2 years. Denies any urinary symptoms. Reports having a single partner for the past 1 year . Will treat her with a single dose of Tinizadole 2 gm upon discharge.. Have instructed to have a partner checked for trichomonas, STD and HIV.  Essential hypertension Resume lisinopril. Blood pressure elevated possibly associated with her current symptoms. Place on when necessary hydralazine. Have instructed to hold HCTZ for next 2 days given severe hypokalemia.     Diet: Clears as tolerated  Family communication: None at bedside Disposition: Home once symptoms improve. Consultants:  None  Procedures:  None  Antibiotics:  None  HPI/Subjective: Had multiple episodes of vomiting followed by watery diarrhea since yesterday afternoon. Afebrile. Complains of some abdominal discomfort with vomiting. No hematemesis or melena.  Objective: Filed Vitals:   07/21/15 0415  BP: 161/94  Pulse: 107  Temp:   Resp:     Intake/Output Summary (Last 24 hours) at 07/21/15 1117 Last data filed at 07/21/15 [id number]  Gross per 24 hour  Intake 3099.16 ml  Output   2406 ml  Net 693.16 ml   Filed Weights   07/20/15 0039  Weight: 89.086 kg  (196 lb 6.4 oz)    Exam:   General:  He lives female lying in bed appears fatigued  HEENT: No pallor, dry oral mucosa, supple neck   chest: Clear to auscultation bilaterally  CVS: Normal S1 and S2, no murmurs  GI: Soft, nondistended, nontender, hyperactive bowel sounds  Musculoskeletal: Warm, no edema     Data Reviewed: Basic Metabolic Panel:  Recent Labs Lab 07/20/15 0807 07/21/15 0440  NA 138 139  K 2.8* 3.6  CL 107 107  CO2 25 22  GLUCOSE 117* 122*  BUN <5* <5*  CREATININE 0.70 0.77  CALCIUM 8.1* 8.8*   Liver Function Tests:  Recent Labs Lab 07/20/15 0807 07/21/15 0440  AST 18 19  ALT 12* 13*  ALKPHOS 78 92  BILITOT 0.4 0.5  PROT 6.0* 6.9  ALBUMIN 2.8* 3.4*   No results for input(s): LIPASE, AMYLASE in the last 168 hours. No results for input(s): AMMONIA in the last 168 hours. CBC:  Recent Labs Lab 07/20/15 0807 07/21/15 0658  WBC 4.7 8.2  NEUTROABS  --  6.7  HGB 10.6* 12.7  HCT 32.8* 37.6  MCV 77.9* 77.4*  PLT 317 377   Cardiac Enzymes: No results for input(s): CKTOTAL, CKMB, CKMBINDEX, TROPONINI in the last 168 hours. BNP (last 3 results) No results for input(s): BNP in the last 8760 hours.  ProBNP (last 3 results) No results for input(s): PROBNP in the last 8760 hours.  CBG: No results for input(s): GLUCAP in the last 168 hours.  No results found for this or any previous visit (from the past 240 hour(s)).   Studies: No results found.  Scheduled Meds: . lisinopril  40 mg Oral Daily  Continuous Infusions: . 0.9 % NaCl with KCl 20 mEq / L 125 mL/hr at 07/20/15 1901        Time spent: 25 minutes    Louellen Molder  [facility] Pager [phone]. If 7PM-7AM, please contact night-coverage at [email or web address], password [facility] 07/21/2015, 11:17 AM  LOS: 1 day

## 2015-07-21 NOTE — Progress Notes (Signed)
Pt 's status over the shift; Pt's BP 160/90 twice during the shift. Hydralazine was given according to prescription.  Pt had two loose bowel movements during night shift and the second BM was bright red tinged. Pt consistently had episodes of N/V, prescribed Phenergan and Zofran were consistently given as prescribed without significant relief. G.I. Cocktail was also ordered and given as prescribed. Pt complained of H/A and pain. MD notified and Oxycodone was prescribed and given to pt. Pt reported relief.  MD notified about the pt's status progression.  Orders placed (i) Hemoccult  (ii) Ativan for N/V (iii) CBC with diff/Platelet (iv) Comp Met. Panel We will continue to monitor the patient.

## 2015-07-21 NOTE — Progress Notes (Signed)
Pt called nurse c/o chest pain. Pain described as burning/aching. VS taken. BP 189/104 HR 112. Made Dr Clementeen Graham aware. Administering Hydralazine 10mg  IV. Phenergan PRN also given for c/o nausea. Waiting to hear from dr. Aletha Halim continue to monitor. Bed remains in lowest position and call bell is within reach.  1550 BP rechecked 165/95.

## 2015-07-22 ENCOUNTER — Inpatient Hospital Stay (HOSPITAL_COMMUNITY): Payer: Medicaid Other

## 2015-07-22 ENCOUNTER — Encounter (HOSPITAL_COMMUNITY): Payer: Self-pay | Admitting: *Deleted

## 2015-07-22 LAB — COMPREHENSIVE METABOLIC PANEL
ALT: 9 U/L — AB (ref 14–54)
AST: 20 U/L (ref 15–41)
Albumin: 3.6 g/dL (ref 3.5–5.0)
Alkaline Phosphatase: 87 U/L (ref 38–126)
Anion gap: 9 (ref 5–15)
BUN: 6 mg/dL (ref 6–20)
CHLORIDE: 101 mmol/L (ref 101–111)
CO2: 23 mmol/L (ref 22–32)
Calcium: 9 mg/dL (ref 8.9–10.3)
Creatinine, Ser: 0.66 mg/dL (ref 0.44–1.00)
GFR calc Af Amer: >60 mL/min (ref >60)
Glucose, Bld: 121 mg/dL — ABNORMAL HIGH (ref 65–99)
POTASSIUM: 3.7 mmol/L (ref 3.5–5.1)
SODIUM: 133 mmol/L — AB (ref 135–145)
Total Bilirubin: 0.6 mg/dL (ref 0.3–1.2)
Total Protein: 7.9 g/dL (ref 6.5–8.1)

## 2015-07-22 LAB — CBC
HEMATOCRIT: 38.7 % (ref 36.0–46.0)
HEMOGLOBIN: 12.4 g/dL (ref 12.0–15.0)
MCH: 24.9 pg — ABNORMAL LOW (ref 26.0–34.0)
MCHC: 32 g/dL (ref 30.0–36.0)
MCV: 77.7 fL — AB (ref 78.0–100.0)
Platelets: 435 10*3/uL — ABNORMAL HIGH (ref 150–400)
RBC: 4.98 MIL/uL (ref 3.87–5.11)
RDW: 14.8 % (ref 11.5–15.5)
WBC: 9.9 10*3/uL (ref 4.0–10.5)

## 2015-07-22 LAB — LACTIC ACID, PLASMA: LACTIC ACID, VENOUS: 1.4 mmol/L (ref 0.5–2.0)

## 2015-07-22 LAB — TROPONIN I
TROPONIN I: 0.03 ng/mL (ref <0.031)
TROPONIN I: 0.07 ng/mL — AB (ref <0.031)
TROPONIN I: 0.06 ng/mL — AB (ref <0.031)

## 2015-07-22 MED ORDER — BARIUM SULFATE 2.1 % PO SUSP
ORAL | Status: AC
  Administered 2015-07-22: 1 mL
  Filled 2015-07-22: qty 2

## 2015-07-22 MED ORDER — PANTOPRAZOLE SODIUM 40 MG IV SOLR
40.0000 mg | Freq: Once | INTRAVENOUS | Status: AC
  Administered 2015-07-22: 40 mg via INTRAVENOUS
  Filled 2015-07-22: qty 40

## 2015-07-22 MED ORDER — ASPIRIN 81 MG PO CHEW
324.0000 mg | CHEWABLE_TABLET | Freq: Once | ORAL | Status: AC
  Administered 2015-07-22: 324 mg via ORAL
  Filled 2015-07-22: qty 4

## 2015-07-22 MED ORDER — IOHEXOL 300 MG/ML  SOLN
100.0000 mL | Freq: Once | INTRAMUSCULAR | Status: AC | PRN
  Administered 2015-07-22: 100 mL via INTRAVENOUS

## 2015-07-22 MED ORDER — GI COCKTAIL ~~LOC~~
30.0000 mL | Freq: Once | ORAL | Status: AC
  Administered 2015-07-22: 30 mL via ORAL
  Filled 2015-07-22: qty 30

## 2015-07-22 NOTE — Progress Notes (Signed)
Pt c/o chest pain describing it as a burning pain. [facility] representative/NP notified. VS obtained as well as EKG as ordered. New orders received and implemented for GI cocktail and aspirin. Will continue to monitor. Call bell is within reach.

## 2015-07-22 NOTE — Progress Notes (Signed)
[facility] PROGRESS NOTE  LAROYA RENNERT [id number] DOB: [birth year] DOA: 07/20/2015 PCP: Glenda Chroman., MD   44 year old female with history of hypertension, hyperlipidemia presented at [facility] after having more than 10 episodes of vomiting with watery stool on the morning of 11/17.  reported having a meal at [employer] on the prior evening. She was afebrile with workup done at [facility] showing metabolic acidosis with anion gap of 17 and UA showing bacteria with trichomonas. She was not improved with several rounds of antiemetics in the ED and was thus transferred to [facility] as per patient request. Still coarse prolonged due to ongoing symptoms. Assessment/Plan: Acute gastroenteritis Possibly associated with food poisoning.  Symptoms had resolved was plan to be discharged home 11/18 but patient continues to have several episodes of vomiting and watery diarrhea. GI pathogen panel sent. Very low suspicion for C. difficile. Place her on scheduled Reglan and when necessary Phenergan. Continue IV hydration. -Lactic acid normal..  has diffuse abdominal pain. Will check CT of the abdomen. BMET normal. -If symptoms persistent will consult GI.  Tachycardia Associated with severe dehydration. Asymptomatic. 2-D echo done with EF of 55 and 62% and grade 1 diastolic dysfunction. No valvular abnormality noted.  Elevated troponin Complained of substernal pain on 11/19. This is likely secondary to her retching and vomiting. EKG unremarkable. The echo unremarkable as well. Would not work her up further  Hypokalemia Replenished.  Trichomonas vaginitis Patient reports having multiple partners over the past 2 years. Denies any urinary symptoms. Reports having a single partner for the past 1 year . Will treat her with a single dose of Tinizadole 2 gm upon discharge.. Have instructed to have a partner checked for trichomonas, STD and HIV.  Essential hypertension Resumed  lisinopril. Blood pressure elevated possibly associated with her current symptoms. on when necessary hydralazine. Holding HCTZ given severe hypokalemia.      Diet: Clears as tolerated  Family communication: None at bedside Disposition: Home once symptoms improve.  Consultants:  None  Procedures:  None  Antibiotics:  None  HPI/Subjective: Having multiple episodes of vomiting and diarrhea overnight.  Objective: Filed Vitals:   07/22/15 0547  BP: 154/72  Pulse: 109  Temp: 98 F (36.7 C)  Resp: 18    Intake/Output Summary (Last 24 hours) at 07/22/15 1203 Last data filed at 07/22/15 0859  Gross per 24 hour  Intake 3259.59 ml  Output   1800 ml  Net 1459.59 ml   Filed Weights   07/20/15 0039  Weight: 89.086 kg (196 lb 6.4 oz)    Exam:   General: Middle aged female appears fatigued  HEENT: No pallor, dry oral mucosa, supple neck   chest: Clear to auscultation bilaterally  CVS: Normal S1 and S2, no murmurs  GI: Soft, nondistended, left lower  quadrant tenderness, hyperactive bowel sounds  Musculoskeletal: Warm, no edema     Data Reviewed: Basic Metabolic Panel:  Recent Labs Lab 07/20/15 0807 07/21/15 0440 07/22/15 1044  NA 138 139 133*  K 2.8* 3.6 3.7  CL 107 107 101  CO2 25 22 23   GLUCOSE 117* 122* 121*  BUN <5* <5* 6  CREATININE 0.70 0.77 0.66  CALCIUM 8.1* 8.8* 9.0   Liver Function Tests:  Recent Labs Lab 07/20/15 0807 07/21/15 0440 07/22/15 1044  AST 18 19 20   ALT 12* 13* 9*  ALKPHOS 78 92 87  BILITOT 0.4 0.5 0.6  PROT 6.0* 6.9 7.9  ALBUMIN 2.8* 3.4* 3.6   No results for  input(s): LIPASE, AMYLASE in the last 168 hours. No results for input(s): AMMONIA in the last 168 hours. CBC:  Recent Labs Lab 07/20/15 0807 07/21/15 0658  WBC 4.7 8.2  NEUTROABS  --  6.7  HGB 10.6* 12.7  HCT 32.8* 37.6  MCV 77.9* 77.4*  PLT 317 377   Cardiac Enzymes:  Recent Labs Lab 07/22/15 0407 07/22/15 0940  TROPONINI 0.03 0.07*    BNP (last 3 results) No results for input(s): BNP in the last 8760 hours.  ProBNP (last 3 results) No results for input(s): PROBNP in the last 8760 hours.  CBG: No results for input(s): GLUCAP in the last 168 hours.  No results found for this or any previous visit (from the past 240 hour(s)).   Studies: No results found.  Scheduled Meds: . famotidine (PEPCID) IV  20 mg Intravenous Q12H  . lisinopril  40 mg Oral Daily  . metoCLOPramide (REGLAN) injection  10 mg Intravenous 4 times per day   Continuous Infusions: . 0.9 % NaCl with KCl 20 mEq / L 125 mL/hr at 07/22/15 0839        Time spent: 25 minutes    Katiana Ruland, [place]  [facility] Pager [phone]. If 7PM-7AM, please contact night-coverage at [email or web address], password [facility] 07/22/2015, 12:03 PM  LOS: 2 days

## 2015-07-23 ENCOUNTER — Encounter (HOSPITAL_COMMUNITY): Payer: Self-pay | Admitting: Physician Assistant

## 2015-07-23 ENCOUNTER — Inpatient Hospital Stay (HOSPITAL_COMMUNITY): Payer: Medicaid Other

## 2015-07-23 DIAGNOSIS — M549 Dorsalgia, unspecified: Secondary | ICD-10-CM | POA: Insufficient documentation

## 2015-07-23 DIAGNOSIS — E860 Dehydration: Secondary | ICD-10-CM

## 2015-07-23 DIAGNOSIS — R1110 Vomiting, unspecified: Secondary | ICD-10-CM | POA: Insufficient documentation

## 2015-07-23 DIAGNOSIS — A09 Infectious gastroenteritis and colitis, unspecified: Secondary | ICD-10-CM | POA: Insufficient documentation

## 2015-07-23 DIAGNOSIS — K529 Noninfective gastroenteritis and colitis, unspecified: Secondary | ICD-10-CM

## 2015-07-23 DIAGNOSIS — M5489 Other dorsalgia: Secondary | ICD-10-CM

## 2015-07-23 LAB — GLUCOSE, CAPILLARY: Glucose-Capillary: 87 mg/dL (ref 65–99)

## 2015-07-23 MED ORDER — METOPROLOL TARTRATE 1 MG/ML IV SOLN
5.0000 mg | Freq: Once | INTRAVENOUS | Status: AC
  Administered 2015-07-23: 5 mg via INTRAVENOUS
  Filled 2015-07-23: qty 5

## 2015-07-23 MED ORDER — ACETAMINOPHEN 325 MG PO TABS
650.0000 mg | ORAL_TABLET | Freq: Four times a day (QID) | ORAL | Status: DC | PRN
  Filled 2015-07-23: qty 2

## 2015-07-23 NOTE — Progress Notes (Signed)
Pt BP and heart rate has been  running high all her PRN med given as ordered, md on call was paged about pt condition will continue to monitor

## 2015-07-23 NOTE — Progress Notes (Signed)
[facility] PROGRESS NOTE  Abigail Burgess [id number] DOB: 02/27/1971 DOA: 07/20/2015 PCP: Glenda Chroman., MD   Brief narrative 44 year old female with history of hypertension, hyperlipidemia presented at [facility] after having more than 10 episodes of vomiting with watery stool on the morning of 11/17.  reported having a meal at [employer] on the prior evening. She was afebrile with workup done at [facility] showing metabolic acidosis with anion gap of 17 and UA showing bacteria with trichomonas. She was not improved with several rounds of antiemetics in the ED and was thus transferred to [facility] as per patient request. Hospital coarse prolonged due to ongoing symptoms.   Assessment/Plan: Acute gastroenteritis Possibly associated with food poisoning.  Symptoms had resolved was plan to be discharged home 11/18 but patient continues to have several episodes of vomiting and watery diarrhea. GI pathogen panel sent. Very low suspicion for C. difficile. on scheduled Reglan and when necessary Phenergan. Continue IV hydration. twice a day Pepcid for heartburn symptoms. given persistent symptoms sent stool for C. difficile and GI pathogen panel.  -Lactic acid normal..  CT of the abdomen done to check abdominal pain and was negative. Marland Kitchen BMET normal. -GI consult appreciated given persistent symptoms. Recommend supportive care.   Tachycardia Associated with severe dehydration. Asymptomatic. 2-D echo done with EF of 55 and 62% and grade 1 diastolic dysfunction. No valvular abnormality noted.  Elevated troponin Complained of substernal pain on 11/19. This is likely secondary to her retching and vomiting. EKG unremarkable.  echo unremarkable as well.  No cardiac symptoms.  Hypokalemia  Replenished.  Trichomonas vaginitis Patient reports having multiple partners over the past 2 years. Denies any urinary symptoms. Reports having a single partner for the past 1 year . Will treat her  with a single dose of Tinizadole 2 gm upon discharge.. Have instructed to have a partner checked for trichomonas, STD and HIV.  Essential hypertension Resumed lisinopril. Blood pressure elevated possibly associated with her current symptoms. on when necessary hydralazine. Holding HCTZ given severe hypokalemia. using upon discharge.     low back pain Reports having symptoms as outpatient but worsening for the past 2 days. Localized tenderness in the lumbar area. Pain is nonradiating. X-ray of lumbar spine shows spondylosis. Will order prn tylenol.   Diet: Clears as tolerated  Family communication: None at bedside Disposition: Home once symptoms improve.  Consultants:  None  Procedures:  None  Antibiotics:  None  HPI/Subjective Still having vomiting and 1-2 episodes of diarrhea. (Improved from prior days). denies abdominal pain.   jectiveDanley Danker Vitals:   07/23/15 0549 07/23/15 0721  BP:  172/91  Pulse: 119 97  Temp:    Resp:      Intake/Output Summary (Last 24 hours) at 07/23/15 1319 Last data filed at 07/23/15 1251  Gross per 24 hour  Intake   1460 ml  Output    600 ml  Net    860 ml   Filed Weights   07/20/15 0039  Weight: 89.086 kg (196 lb 6.4 oz)    Exam:   General: Middle aged female appears fatigued  HEENT: No pallor, dry oral mucosa, supple neck   chest: Clear to auscultation bilaterally  CVS: Normal S1 and S2, no murmurs  GI: Soft, nondistended,  nontender hyperactive bowel sounds  Musculoskeletal: Warm, no edema     Data Reviewed: Basic Metabolic Panel:  Recent Labs Lab 07/20/15 0807 07/21/15 0440 07/22/15 1044  NA 138 139 133*  K 2.8* 3.6 3.7  CL 107 107 101  CO2 25 22 23   GLUCOSE 117* 122* 121*  BUN <5* <5* 6  CREATININE 0.70 0.77 0.66  CALCIUM 8.1* 8.8* 9.0   Liver Function Tests:  Recent Labs Lab 07/20/15 0807 07/21/15 0440 07/22/15 1044  AST 18 19 20   ALT 12* 13* 9*  ALKPHOS 78 92 87  BILITOT 0.4 0.5 0.6   PROT 6.0* 6.9 7.9  ALBUMIN 2.8* 3.4* 3.6   No results for input(s): LIPASE, AMYLASE in the last 168 hours. No results for input(s): AMMONIA in the last 168 hours. CBC:  Recent Labs Lab 07/20/15 0807 07/21/15 0658 07/22/15 1509  WBC 4.7 8.2 9.9  NEUTROABS  --  6.7  --   HGB 10.6* 12.7 12.4  HCT 32.8* 37.6 38.7  MCV 77.9* 77.4* 77.7*  PLT 317 377 435*   Cardiac Enzymes:  Recent Labs Lab 07/22/15 0407 07/22/15 0940 07/22/15 1509  TROPONINI 0.03 0.07* 0.06*   BNP (last 3 results) No results for input(s): BNP in the last 8760 hours.  ProBNP (last 3 results) No results for input(s): PROBNP in the last 8760 hours.  CBG:  Recent Labs Lab 07/23/15 0448  GLUCAP 87    No results found for this or any previous visit (from the past 240 hour(s)).   Studies: Dg Lumbar Spine 2-3 Views  07/23/2015  CLINICAL DATA:  Upper to mid lumbar spine pain. Recent vomiting. No known injury. EXAM: LUMBAR SPINE - 2-3 VIEW COMPARISON:  CT abdomen and pelvis 07/22/2015. Lumbar spine radiographs 10/05/2012. FINDINGS: There are 5 non rib-bearing lumbar type vertebral bodies. Vertebral alignment is normal. Mild endplate osteophytosis in the lumbar and visualized lower thoracic spine is similar to minimally more prominent than on the prior radiographs, greatest at L3-4. Intervertebral disc space heights remain preserved. No destructive osseous lesion is seen. IMPRESSION: Mild lumbar spondylosis. Electronically Signed   By: Logan Bores M.D.   On: 07/23/2015 09:21   Ct Abdomen Pelvis W Contrast  07/22/2015  CLINICAL DATA:  Pain in the umbilical area, diarrhea, nausea and vomiting. EXAM: CT ABDOMEN AND PELVIS WITH CONTRAST TECHNIQUE: Multidetector CT imaging of the abdomen and pelvis was performed using the standard protocol following bolus administration of intravenous contrast. CONTRAST:  154mL OMNIPAQUE IOHEXOL 300 MG/ML  SOLN COMPARISON:  None. FINDINGS: Lower chest:  No acute findings.  Hepatobiliary: No masses or other significant abnormality. Pancreas: No mass, inflammatory changes, or other significant abnormality. Spleen: Within normal limits in size and appearance. Adrenals/Urinary Tract: No masses identified. No evidence of hydronephrosis. Stomach/Bowel: No evidence of obstruction, inflammatory process, or abnormal fluid collections. Vascular/Lymphatic: No pathologically enlarged lymph nodes. No evidence of abdominal aortic aneurysm. Reproductive: There is a mostly exophytic 5 cm left fundal myometrial mass, likely representing a fibroid. A smaller intramural 18 mm myometrial mass is also seen within the left body of the uterus. Fallopian tube occlusion devices are seen. Other: Small amount of free fluid in the pelvis. Musculoskeletal:  No suspicious bone lesions identified. IMPRESSION: No acute findings within the abdomen. Myometrial masses, including a 5 cm mostly exophytic left fundal subserosal mass, likely representing fibroids. Further evaluation with nonemergent pelvic ultrasound may be considered. Electronically Signed   By: Fidela Salisbury M.D.   On: 07/22/2015 15:16    Scheduled Meds: . famotidine (PEPCID) IV  20 mg Intravenous Q12H  . lisinopril  40 mg Oral Daily  . metoCLOPramide (REGLAN) injection  10 mg Intravenous 4 times per day   Continuous Infusions: . 0.9 %  NaCl with KCl 20 mEq / L 125 mL/hr at 07/23/15 0259        Time spent: 25 minutes    Placida Cambre, [place]  [facility] Pager [phone]. If 7PM-7AM, please contact night-coverage at [email or web address], password [facility] 07/23/2015, 1:19 PM  LOS: 3 days

## 2015-07-23 NOTE — Consult Note (Addendum)
[facility] Consult: 11:33 AM 07/23/2015  LOS: 3 days    Referring Provider: Dr Clementeen Graham.  Primary Care Physician:  Glenda Chroman., MD Primary Gastroenterologist:  unassigned    Reason for Consultation:  N/V/D.     HPI: Abigail Burgess is a 44 y.o. female.  Hx hypertension, hyperlipidemia.  Uterine fibroids.  Hx iron def anemia attributed to menorrhagia, treated in past with po iron but never with transfusions.  Iron eventually discontinued.   Admitted 11/18 with acute gastroenteritis. Family requested pt transfer from [facility] to [facility] for admission. On Thursday night she ate chicken from [employer] that tasted bad and she ended up disposing of it and did not eat all of it.  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  By 9 AM started having n/v/loose stools.  No abd pain.  Eventually having chills.  Stools "every 15 minutes, and frequent vomiting.  Never passed anything bloody or CG looking.  As of yesterday was down to 3 episodes watery and smaller volume of diarrhea, 3 to 4 episodes of emesis.  Belly is now sore but no deep, visceral pain.  She is getting scheduled Reglan IV, Pepcid IV, tid GI cocktail, Zofran prn.  Stool pathogen and C diff studies ordered but not yet collected.      CT scan shows nothing acute, no GI/hepatic/biliary pathology.  + for uterine fibroids.   LFTs, lipase normal. Na 139 initially normal but to 133 today. Anion gap, metabolic acidosis at [facility].  Potassium was 2.8 but has corrected.  Hgb normal but MCV is low at 77.  U/A showed trace leukocytes, large RBCs, trichomonas.  Mild increase in Troponins to max 0.07.   No sick peers or obviously sick kids under her care.  No one else at the chicken.  Takes Ibuprofen 200 to 400 mg at most 1 x per month.  No  previous issues with GERD sxs, dysphagia, altered BMs.  No anorexia, no weight loss or gain.   Dad diagnosed with colon cancer in his 42s, s/p surgery and chemo.  Died of unrelated cause age 70. No fh of sickle cell.  She has not been on iron in several years.   Has regular, heavy menstrual bleeding.  No menometrorrhagia.  No unusual bleeding or bruising.      Past Medical History  Diagnosis Date  . HTN (hypertension) 07/2015  . HLD (hyperlipidemia) 07/2015  . Spondylosis 07/2015    Lumbar  . Uterine fibroids affecting pregnancy   . Iron deficiency anemia in past, well befor 2016    Rx with po iron but was stopped when blood counts inmproved.  no hx blood transfusion    Past Surgical History  Procedure Laterality Date  . Tubal ligation  ~2001  . Vaginal delivery  1998    Prior to Admission medications   Medication Sig Start Date End Date Taking? Authorizing Provider  ibuprofen (ADVIL,MOTRIN) 200 MG tablet Take 400 mg by mouth every 6 (six) hours as needed for headache or mild pain.   Yes  Historical Provider, MD  lisinopril (PRINIVIL,ZESTRIL) 40 MG tablet Take 40 mg by mouth daily.   Yes Historical Provider, MD  hydrochlorothiazide (HYDRODIURIL) 25 MG tablet Take 1 tablet (25 mg total) by mouth daily. 07/22/15   Nishant Dhungel, MD  lisinopril (PRINIVIL,ZESTRIL) 40 MG tablet Take 1 tablet (40 mg total) by mouth daily. 07/20/15   Nishant Dhungel, MD  tinidazole (TINDAMAX) 500 MG tablet Take 4 tablets (2,000 mg total) by mouth once. 07/20/15   Nishant Dhungel, MD    Scheduled Meds: . famotidine (PEPCID) IV  20 mg Intravenous Q12H  . lisinopril  40 mg Oral Daily  . metoCLOPramide (REGLAN) injection  10 mg Intravenous 4 times per day   Infusions: . 0.9 % NaCl with KCl 20 mEq / L 125 mL/hr at 07/23/15 0259   PRN Meds: gi cocktail, hydrALAZINE, morphine injection, ondansetron **OR** ondansetron (ZOFRAN) IV, oxyCODONE, promethazine   Allergies as of 07/19/2015  . (Not on File)     Family History  Problem Relation Age of Onset  . Colon cancer Father     developed in his 22s, underwent surgery and chemo, was not his cause of death age 50.     Social History   Social History  . Marital Status: Married    Spouse Name: N/A  . Number of Children: N/A  . Years of Education: N/A   Occupational History  . day care     works in day care center, changes diapers.   Social History Main Topics  . Smoking status: Unknown If Ever Smoked  . Smokeless tobacco: Not on file  . Alcohol Use: Not on file  . Drug Use: Not on file  . Sexual Activity: Not on file   Other Topics Concern  . Not on file   Social History Narrative    REVIEW OF SYSTEMS: Constitutional:  Generally strong and no issues with fatigue or weakness.  ENT:  No nose bleeds Pulm:  No SOB or cough CV:  No palpitations, no LE edema.  GU:  No hematuria, no frequency GI:  Per HPI Heme:  Per HPI   Transfusions:  None ever Neuro:  No headaches, no peripheral tingling or numbness Derm:  No itching, no rash or sores.  Endocrine:  No sweats or chills.  No polyuria or dysuria Immunization:  Did not inquire Travel:  None beyond local counties in last few months.    PHYSICAL EXAM: Vital signs in last 24 hours: Filed Vitals:   07/23/15 0549 07/23/15 0721  BP:  172/91  Pulse: 119 97  Temp:    Resp:     Wt Readings from Last 3 Encounters:  07/20/15 89.086 kg (196 lb 6.4 oz)    General: Pleasant, somewhat ill but not toxic appearing AAF Head:  No facial asymmetry or swelling.  Eyes:  No scleral icterus. No conjunctival pallor. Ears:  Not hard of hearing  Nose:  No discharge or congestion Mouth:  Some missing teeth. Overall dentition fair. Mucosa is pink, moist and clear. Neck:  No JVD, no TMG, no masses Lungs:  Clear to auscultation bilaterally. No cough. No dyspnea Heart: RRR. No MRG. S1/S2 audible. Abdomen:  Soft, ND.  No HSM, masses, bruits.  Diffuse soreness/tenderness in all 4 quads.   No guard/rebound or focal tenderness.   Rectal: deferred   Musc/Skeltl: No joint deformity, redness, swelling. Extremities:No CCE. Neurologic:  Oriented 3. Alert. No limb weakness. No tremors. No gross neurologic deficits. Skin:  no telangiectasia, sores or rashes. Tattoo:  None observed.  Nodes:  no cervical or inguinal adenopathy.    Psych:  Pleasant. affect depressed. Calm. Cooperative.   Intake/Output from previous day: 11/20 0701 - 11/21 0700 In: 1480 [P.O.:480; I.V.:1000] Out: 800 [Urine:800] Intake/Output this shift:    LAB RESULTS:  Recent Labs  07/21/15 0658 07/22/15 1509  WBC 8.2 9.9  HGB 12.7 12.4  HCT 37.6 38.7  PLT 377 435*   BMET Lab Results  Component Value Date   NA 133* 07/22/2015   NA 139 07/21/2015   NA 138 07/20/2015   K 3.7 07/22/2015   K 3.6 07/21/2015   K 2.8* 07/20/2015   CL 101 07/22/2015   CL 107 07/21/2015   CL 107 07/20/2015   CO2 23 07/22/2015   CO2 22 07/21/2015   CO2 25 07/20/2015   GLUCOSE 121* 07/22/2015   GLUCOSE 122* 07/21/2015   GLUCOSE 117* 07/20/2015   BUN 6 07/22/2015   BUN <5* 07/21/2015   BUN <5* 07/20/2015   CREATININE 0.66 07/22/2015   CREATININE 0.77 07/21/2015   CREATININE 0.70 07/20/2015   CALCIUM 9.0 07/22/2015   CALCIUM 8.8* 07/21/2015   CALCIUM 8.1* 07/20/2015   LFT  Recent Labs  07/21/15 0440 07/22/15 1044  PROT 6.9 7.9  ALBUMIN 3.4* 3.6  AST 19 20  ALT 13* 9*  ALKPHOS 92 87  BILITOT 0.5 0.6   PT/INR No results found for: INR, PROTIME Hepatitis Panel No results for input(s): HEPBSAG, HCVAB, HEPAIGM, HEPBIGM in the last 72 hours. C-Diff No components found for: CDIFF Lipase  No results found for: LIPASE  Drugs of Abuse  No results found for: LABOPIA, COCAINSCRNUR, LABBENZ, AMPHETMU, THCU, LABBARB   RADIOLOGY STUDIES: Dg Lumbar Spine 2-3 Views  07/23/2015  CLINICAL DATA:  Upper to mid lumbar spine pain. Recent vomiting. No known injury. EXAM: LUMBAR SPINE - 2-3 VIEW COMPARISON:  CT  abdomen and pelvis 07/22/2015. Lumbar spine radiographs 10/05/2012. FINDINGS: There are 5 non rib-bearing lumbar type vertebral bodies. Vertebral alignment is normal. Mild endplate osteophytosis in the lumbar and visualized lower thoracic spine is similar to minimally more prominent than on the prior radiographs, greatest at L3-4. Intervertebral disc space heights remain preserved. No destructive osseous lesion is seen. IMPRESSION: Mild lumbar spondylosis. Electronically Signed   By: Logan Bores M.D.   On: 07/23/2015 09:21   Ct Abdomen Pelvis W Contrast  07/22/2015  CLINICAL DATA:  Pain in the umbilical area, diarrhea, nausea and vomiting. EXAM: CT ABDOMEN AND PELVIS WITH CONTRAST TECHNIQUE: Multidetector CT imaging of the abdomen and pelvis was performed using the standard protocol following bolus administration of intravenous contrast. CONTRAST:  142mL OMNIPAQUE IOHEXOL 300 MG/ML  SOLN COMPARISON:  None. FINDINGS: Lower chest:  No acute findings. Hepatobiliary: No masses or other significant abnormality. Pancreas: No mass, inflammatory changes, or other significant abnormality. Spleen: Within normal limits in size and appearance. Adrenals/Urinary Tract: No masses identified. No evidence of hydronephrosis. Stomach/Bowel: No evidence of obstruction, inflammatory process, or abnormal fluid collections. Vascular/Lymphatic: No pathologically enlarged lymph nodes. No evidence of abdominal aortic aneurysm. Reproductive: There is a mostly exophytic 5 cm left fundal myometrial mass, likely representing a fibroid. A smaller intramural 18 mm myometrial mass is also seen within the left body of the uterus. Fallopian tube occlusion devices are seen. Other: Small amount of free fluid in the pelvis. Musculoskeletal:  No suspicious bone lesions identified. IMPRESSION: No acute findings within the abdomen. Myometrial masses, including a 5 cm mostly exophytic left fundal subserosal mass, likely representing  fibroids. Further  evaluation with nonemergent pelvic ultrasound may be considered. Electronically Signed   By: Fidela Salisbury M.D.   On: 07/22/2015 15:16    ENDOSCOPIC STUDIES: None ever.   IMPRESSION:   *  N/V/D.  Acute onset c/w viral gastroenteritis.  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  sxs are improving, just not resolved.   *  Microcytosis without anemia.  Hx of same, attributed to heavy period bleeding, which she still expreiences  *  + fh colon cancer in her dad, diagnosed in his 94s.  She eventually will need outpt screening colonoscopy once her acute sxs have resolved  *  UA at [facility] apparently with trich, RBCs, trace leukocytes.  Do not see any records in chart from [facility].  Has not received any abx.     PLAN:     *  Continue supportive care. Stopped hydrocodone.   Eventual colonoscopy as outpt.     Azucena Freed  07/23/2015, 11:33 AM Pager: [phone]  GI ATTENDING  History, laboratories, x-rays reviewed. Patient personally seen and examined. Husband in room. Agree with consultation note as outlined above. Patient presents with acute illness manifested by nausea with vomiting, abdominal cramping, and diarrhea. The process is most consistent with acute viral gastroenteritis. CT scan negative mitigating against inflammatory processes. Agree with stool studies. No indication for antibiotics. Mainstay of treatment is vigorous hydration and symptomatic therapy such as antiemetics. Increase activity as tolerated (encouraged) and advance diet as tolerated. When The patient is able to maintain diet she will be ready to return home. No further recommendations or diagnostic studies from GI standpoint. Please contact us if you have questions or problems. It may require of supportive care several days before she feels well enough to go home. She will need screening colonoscopy when she is completely over her illness, given her family  history. We would be happy to provide this service in [place] or she could have this completed closer to her home.   Docia Chuck. Geri Seminole., M.D. [facility] Division of Gastroenterology

## 2015-07-24 LAB — C DIFFICILE QUICK SCREEN W PCR REFLEX
C DIFFICILE (CDIFF) INTERP: NEGATIVE
C DIFFICLE (CDIFF) ANTIGEN: NEGATIVE
C Diff toxin: NEGATIVE

## 2015-07-24 MED ORDER — ENOXAPARIN SODIUM 40 MG/0.4ML ~~LOC~~ SOLN
40.0000 mg | SUBCUTANEOUS | Status: DC
  Administered 2015-07-24 – 2015-07-27 (×4): 40 mg via SUBCUTANEOUS
  Filled 2015-07-24 (×4): qty 0.4

## 2015-07-24 MED ORDER — AMLODIPINE BESYLATE 5 MG PO TABS
5.0000 mg | ORAL_TABLET | Freq: Every day | ORAL | Status: DC
  Administered 2015-07-24: 5 mg via ORAL
  Filled 2015-07-24: qty 1

## 2015-07-24 MED ORDER — HYDRALAZINE HCL 20 MG/ML IJ SOLN
20.0000 mg | INTRAMUSCULAR | Status: DC | PRN
  Administered 2015-07-24 – 2015-07-25 (×2): 20 mg via INTRAVENOUS
  Filled 2015-07-24 (×2): qty 1

## 2015-07-24 MED ORDER — LABETALOL HCL 5 MG/ML IV SOLN
10.0000 mg | Freq: Once | INTRAVENOUS | Status: AC
  Administered 2015-07-24: 10 mg via INTRAVENOUS
  Filled 2015-07-24: qty 4

## 2015-07-24 MED ORDER — AMLODIPINE BESYLATE 10 MG PO TABS
10.0000 mg | ORAL_TABLET | Freq: Every day | ORAL | Status: DC
  Administered 2015-07-25 – 2015-07-27 (×3): 10 mg via ORAL
  Filled 2015-07-24 (×3): qty 1

## 2015-07-24 MED ORDER — SODIUM CHLORIDE 0.9 % IV BOLUS (SEPSIS)
2000.0000 mL | Freq: Once | INTRAVENOUS | Status: AC
  Administered 2015-07-24: 2000 mL via INTRAVENOUS

## 2015-07-24 MED ORDER — FAMOTIDINE 20 MG PO TABS
20.0000 mg | ORAL_TABLET | Freq: Two times a day (BID) | ORAL | Status: DC
  Administered 2015-07-24 – 2015-07-27 (×7): 20 mg via ORAL
  Filled 2015-07-24 (×7): qty 1

## 2015-07-24 NOTE — Progress Notes (Signed)
Pharmacist Provided - Patient Medication Education Prior to Discharge   Abigail Burgess is an 44 y.o. female who presented to [facility] on 07/20/2015 with a chief complaint of No chief complaint on file.    [x]  Patient will be discharged with 2 new medications []  Patient being discharged without any new medications  The following medications were discussed with the patient: Pepcid, Amlodipine, Lisinopril, HCTZ, Reglan   Pain Control medications: []  Yes    []  No  Diabetes Medications: []  Yes    []  No  Heart Failure Medications: []  Yes    []  No  Anticoagulation Medications:  []  Yes    []  No  Antibiotics at discharge: []  Yes    []  No  Allergy Assessment Completed and Updated: []  Yes    []  No Identified Patient Allergies:  Allergies  Allergen Reactions  . Bee Pollen Anaphylaxis  . Ciprofloxacin Hives     Medication Adherence Assessment: []  Excellent (no doses missed/week)      [x]  Good (1 dose missed/week)      []  Partial (2-3 doses missed/week)      []  Poor (>3 doses missed/week)  Barriers to Obtaining Medications: []  Yes [x]  No   Assessment: Pt stated no adherence or cost barriers.  Counseled on new BP and gastroenteritis medications that may be continued on discharge including possible side effects.   Time spent preparing for discharge counseling: 10 mins Time spent counseling patient: 10 mins  Kem Parkinson, PharmD 07/24/2015, 4:42 PM

## 2015-07-24 NOTE — Progress Notes (Addendum)
[facility] PROGRESS NOTE  Abigail Burgess [id number] DOB: 01-06-1971 DOA: 07/20/2015 PCP: Glenda Chroman., MD   Brief narrative 44 year old female with history of hypertension, hyperlipidemia presented at [facility] after having more than 10 episodes of vomiting with watery stool on the morning of 11/17.  reported having a meal at [employer] on the prior evening. She was afebrile with workup done at [facility] showing metabolic acidosis with anion gap of 17 and UA showing bacteria with trichomonas. She was not improved with several rounds of antiemetics in the ED and was thus transferred to [facility] as per patient request. Hospital coarse prolonged due to ongoing symptoms.   Assessment/Plan: Acute gastroenteritis Possibly associated with acute viral gastroenteritis. Symptoms had resolved and was plan to be discharged home 11/18 but patient continues to have several episodes of vomiting and watery diarrhea. For C. difficile negative. Continue aggressive IV hydration and scheduled Reglan with when necessary Phenergan. Added twice a day Pepcid for heartburn symptoms. -Lactic acid normal..  CT of the abdomen done to check abdominal pain and was negative. Marland Kitchen BMET normal. -GI consult appreciated given persistent symptoms. Recommend supportive care.   Tachycardia Associated with severe dehydration. Asymptomatic. 2-D echo done with EF of 55-60% and grade 1 diastolic dysfunction. No valvular abnormality noted.  Elevated troponin Complained of substernal pain on 11/19. This is likely secondary to her retching and vomiting. EKG unremarkable.  echo unremarkable as well.  No cardiac symptoms.  Hypokalemia  Replenished.  Trichomonas vaginitis Patient reports having multiple partners over the past 2 years. Denies any urinary symptoms. Reports having a single partner for the past 1 year . Will treat her with a single dose of Tinizadole 2 gm upon discharge.. Have instructed to have a  partner checked for trichomonas, STD and HIV.  Essential hypertension Ammann controlled Resumed lisinopril. Blood pressure elevated possibly associated with her current symptoms. Added amlodipine and on when necessary hydralazine. Holding HCTZ given severe hypokalemia. Resume upon discharge.     low back pain Reports having symptoms as outpatient but worsening for the past 2 days. Localized tenderness in the lumbar area. Pain is nonradiating. X-ray of lumbar spine shows spondylosis. Stable on when necessary Tylenol.   Diet: Full liquid; advance as tolerated  Family communication: None at bedside Disposition: Home possibly on 11/23 if symptoms improving.  Consultants:  None  Procedures:  None  Antibiotics:  None  HPI/Subjective Had one episode of vomiting this morning. One loose bowel movement this morning. Diarrhea vomiting improving for the past 24 hours.  jectiveDanley Danker Vitals:   07/24/15 0508 07/24/15 0604  BP: 189/104 188/105  Pulse: 115 140  Temp: 98.5 F (36.9 C)   Resp: 18     Intake/Output Summary (Last 24 hours) at 07/24/15 1250 Last data filed at 07/24/15 [id number]  Gross per 24 hour  Intake 3480.41 ml  Output      0 ml  Net 3480.41 ml   Filed Weights   07/20/15 0039  Weight: 89.086 kg (196 lb 6.4 oz)    Exam:   General: Middle aged female appears fatigued  HEENT: No pallor, dry oral mucosa, supple neck   chest: Clear to auscultation bilaterally  CVS: Normal S1 and S2, no murmurs  GI: Soft, nondistended,  nontender hyperactive bowel sounds  Musculoskeletal: Warm, no edema     Data Reviewed: Basic Metabolic Panel:  Recent Labs Lab 07/20/15 0807 07/21/15 0440 07/22/15 1044  NA 138 139 133*  K 2.8* 3.6 3.7  CL  107 107 101  CO2 25 22 23   GLUCOSE 117* 122* 121*  BUN <5* <5* 6  CREATININE 0.70 0.77 0.66  CALCIUM 8.1* 8.8* 9.0   Liver Function Tests:  Recent Labs Lab 07/20/15 0807 07/21/15 0440 07/22/15 1044  AST 18 19 20    ALT 12* 13* 9*  ALKPHOS 78 92 87  BILITOT 0.4 0.5 0.6  PROT 6.0* 6.9 7.9  ALBUMIN 2.8* 3.4* 3.6   No results for input(s): LIPASE, AMYLASE in the last 168 hours. No results for input(s): AMMONIA in the last 168 hours. CBC:  Recent Labs Lab 07/20/15 0807 07/21/15 0658 07/22/15 1509  WBC 4.7 8.2 9.9  NEUTROABS  --  6.7  --   HGB 10.6* 12.7 12.4  HCT 32.8* 37.6 38.7  MCV 77.9* 77.4* 77.7*  PLT 317 377 435*   Cardiac Enzymes:  Recent Labs Lab 07/22/15 0407 07/22/15 0940 07/22/15 1509  TROPONINI 0.03 0.07* 0.06*   BNP (last 3 results) No results for input(s): BNP in the last 8760 hours.  ProBNP (last 3 results) No results for input(s): PROBNP in the last 8760 hours.  CBG:  Recent Labs Lab 07/23/15 0448  GLUCAP 87    Recent Results (from the past 240 hour(s))  C difficile quick scan w PCR reflex     Status: None   Collection Time: 07/24/15 10:30 AM  Result Value Ref Range Status   C Diff antigen NEGATIVE NEGATIVE Final   C Diff toxin NEGATIVE NEGATIVE Final   C Diff interpretation Negative for toxigenic C. difficile  Final     Studies: Dg Lumbar Spine 2-3 Views  07/23/2015  CLINICAL DATA:  Upper to mid lumbar spine pain. Recent vomiting. No known injury. EXAM: LUMBAR SPINE - 2-3 VIEW COMPARISON:  CT abdomen and pelvis 07/22/2015. Lumbar spine radiographs 10/05/2012. FINDINGS: There are 5 non rib-bearing lumbar type vertebral bodies. Vertebral alignment is normal. Mild endplate osteophytosis in the lumbar and visualized lower thoracic spine is similar to minimally more prominent than on the prior radiographs, greatest at L3-4. Intervertebral disc space heights remain preserved. No destructive osseous lesion is seen. IMPRESSION: Mild lumbar spondylosis. Electronically Signed   By: Logan Bores M.D.   On: 07/23/2015 09:21   Ct Abdomen Pelvis W Contrast  07/22/2015  CLINICAL DATA:  Pain in the umbilical area, diarrhea, nausea and vomiting. EXAM: CT ABDOMEN AND  PELVIS WITH CONTRAST TECHNIQUE: Multidetector CT imaging of the abdomen and pelvis was performed using the standard protocol following bolus administration of intravenous contrast. CONTRAST:  155mL OMNIPAQUE IOHEXOL 300 MG/ML  SOLN COMPARISON:  None. FINDINGS: Lower chest:  No acute findings. Hepatobiliary: No masses or other significant abnormality. Pancreas: No mass, inflammatory changes, or other significant abnormality. Spleen: Within normal limits in size and appearance. Adrenals/Urinary Tract: No masses identified. No evidence of hydronephrosis. Stomach/Bowel: No evidence of obstruction, inflammatory process, or abnormal fluid collections. Vascular/Lymphatic: No pathologically enlarged lymph nodes. No evidence of abdominal aortic aneurysm. Reproductive: There is a mostly exophytic 5 cm left fundal myometrial mass, likely representing a fibroid. A smaller intramural 18 mm myometrial mass is also seen within the left body of the uterus. Fallopian tube occlusion devices are seen. Other: Small amount of free fluid in the pelvis. Musculoskeletal:  No suspicious bone lesions identified. IMPRESSION: No acute findings within the abdomen. Myometrial masses, including a 5 cm mostly exophytic left fundal subserosal mass, likely representing fibroids. Further evaluation with nonemergent pelvic ultrasound may be considered. Electronically Signed   By: Thomas Hoff  Dimitrova M.D.   On: 07/22/2015 15:16    Scheduled Meds: . amLODipine  5 mg Oral Daily  . enoxaparin (LOVENOX) injection  40 mg Subcutaneous Q24H  . famotidine  20 mg Oral BID  . lisinopril  40 mg Oral Daily  . metoCLOPramide (REGLAN) injection  10 mg Intravenous 4 times per day   Continuous Infusions: . 0.9 % NaCl with KCl 20 mEq / L 125 mL/hr at 07/24/15 [id number]        Time spent: 25 minutes    Drayke Grabel, [place]  [facility] Pager [phone]. If 7PM-7AM, please contact night-coverage at [email or web address], password [facility] 07/24/2015, 12:50 PM   LOS: 4 days

## 2015-07-25 LAB — BASIC METABOLIC PANEL
Anion gap: 9 (ref 5–15)
CALCIUM: 8.6 mg/dL — AB (ref 8.9–10.3)
CO2: 24 mmol/L (ref 22–32)
CREATININE: 0.59 mg/dL (ref 0.44–1.00)
Chloride: 101 mmol/L (ref 101–111)
GFR calc Af Amer: >60 mL/min (ref >60)
GLUCOSE: 87 mg/dL (ref 65–99)
POTASSIUM: 3.3 mmol/L — AB (ref 3.5–5.1)
SODIUM: 134 mmol/L — AB (ref 135–145)

## 2015-07-25 LAB — URINALYSIS, ROUTINE W REFLEX MICROSCOPIC
Bilirubin Urine: NEGATIVE
GLUCOSE, UA: NEGATIVE mg/dL
HGB URINE DIPSTICK: NEGATIVE
Nitrite: NEGATIVE
PH: 6 (ref 5.0–8.0)
PROTEIN: NEGATIVE mg/dL
Specific Gravity, Urine: 1.011 (ref 1.005–1.030)

## 2015-07-25 LAB — RAPID URINE DRUG SCREEN, HOSP PERFORMED
Amphetamines: NOT DETECTED
BARBITURATES: POSITIVE — AB
BENZODIAZEPINES: NOT DETECTED
Cocaine: NOT DETECTED
Opiates: POSITIVE — AB
Tetrahydrocannabinol: NOT DETECTED

## 2015-07-25 LAB — TSH: TSH: 1.44 u[IU]/mL (ref 0.350–4.500)

## 2015-07-25 LAB — URINE MICROSCOPIC-ADD ON

## 2015-07-25 MED ORDER — POTASSIUM CHLORIDE 10 MEQ/100ML IV SOLN
10.0000 meq | INTRAVENOUS | Status: AC
  Administered 2015-07-25 (×2): 10 meq via INTRAVENOUS
  Filled 2015-07-25 (×2): qty 100

## 2015-07-25 MED ORDER — LOPERAMIDE HCL 2 MG PO CAPS
4.0000 mg | ORAL_CAPSULE | ORAL | Status: DC | PRN
  Administered 2015-07-25: 4 mg via ORAL
  Filled 2015-07-25: qty 2

## 2015-07-25 MED ORDER — ONDANSETRON HCL 4 MG/2ML IJ SOLN
4.0000 mg | Freq: Four times a day (QID) | INTRAMUSCULAR | Status: DC
  Administered 2015-07-25 – 2015-07-27 (×5): 4 mg via INTRAVENOUS
  Filled 2015-07-25 (×7): qty 2

## 2015-07-25 NOTE — Progress Notes (Signed)
PROGRESS NOTE  Abigail Burgess [id number] DOB: 06/24/1971 DOA: 07/20/2015 PCP: Glenda Chroman., MD  Brief narrative 44 year old female with history of hypertension, hyperlipidemia presented at [facility] after having more than 10 episodes of vomiting with watery stool on the morning of 11/17. reported having a meal at [employer] on the prior evening. She was afebrile with workup done at [facility] showing metabolic acidosis with anion gap of 17 and UA showing bacteria with trichomonas. She was not improved with several rounds of antiemetics in the ED and was thus transferred to [facility] as per patient request. Hospital coarse prolonged due to ongoing symptoms. Assessment/Plan: Acute gastroenteritis -Possibly associated with acute viral gastroenteritis--pt works at daycare -since pt continues to have N/V/D-->stool pathogen panel -npo except sips with meds -schedule IV zofran -stool lactoferrin -C. difficile negative. -Continue aggressive IV hydration and scheduled Reglan with when necessary Phenergan. Added twice a day Pepcid for heartburn symptoms. -Lactic acid normal..  -CT of the abdomen done to check abdominal pain and was negative. Marland Kitchen BMET normal. -GI consult appreciated given persistent symptoms. Recommend supportive care.  -lactic acid 1.4 -UA and UDS  Tachycardia -Associated with severe dehydration. Asymptomatic.  -2-D echo done with EF of 55-60% and grade 1 diastolic dysfunction. No valvular abnormality noted. -TSH 1.440  Elevated troponin Complained of substernal pain on 11/19. This is likely secondary to her retching and vomiting. EKG unremarkable. echo unremarkable as well. No cardiac symptoms.  Hypokalemia  -Replenished. -check mag  Trichomonas vaginitis Patient reports having multiple partners over the past 2 years. Denies any urinary symptoms. Reports having a single partner for the past 1 year . Will treat her with a single dose of  metronidazole 2 gm upon discharge.. Have instructed to have a partner checked for trichomonas, STD and HIV.  Essential hypertension  Resumed lisinopril. Blood pressure elevated possibly associated with her current symptoms. Added amlodipine and on when necessary hydralazine. Holding HCTZ given severe hypokalemia. Resume upon discharge.   low back pain Reports having symptoms as outpatient but worsening for the past 2 days. Localized tenderness in the lumbar area. Pain is nonradiating. X-ray of lumbar spine shows spondylosis. Stable on when necessary Tylenol.  Family communication: None at bedside Disposition: Home in 2-3 days     Family Communication:   Pt at beside Disposition Plan:   Home when medically stable      Procedures/Studies: Dg Lumbar Spine 2-3 Views  07/23/2015  CLINICAL DATA:  Upper to mid lumbar spine pain. Recent vomiting. No known injury. EXAM: LUMBAR SPINE - 2-3 VIEW COMPARISON:  CT abdomen and pelvis 07/22/2015. Lumbar spine radiographs 10/05/2012. FINDINGS: There are 5 non rib-bearing lumbar type vertebral bodies. Vertebral alignment is normal. Mild endplate osteophytosis in the lumbar and visualized lower thoracic spine is similar to minimally more prominent than on the prior radiographs, greatest at L3-4. Intervertebral disc space heights remain preserved. No destructive osseous lesion is seen. IMPRESSION: Mild lumbar spondylosis. Electronically Signed   By: Logan Bores M.D.   On: 07/23/2015 09:21   Ct Abdomen Pelvis W Contrast  07/22/2015  CLINICAL DATA:  Pain in the umbilical area, diarrhea, nausea and vomiting. EXAM: CT ABDOMEN AND PELVIS WITH CONTRAST TECHNIQUE: Multidetector CT imaging of the abdomen and pelvis was performed using the standard protocol following bolus administration of intravenous contrast. CONTRAST:  182mL OMNIPAQUE IOHEXOL 300 MG/ML  SOLN COMPARISON:  None. FINDINGS: Lower chest:  No acute findings. Hepatobiliary: No masses  or other  significant abnormality. Pancreas: No mass, inflammatory changes, or other significant abnormality. Spleen: Within normal limits in size and appearance. Adrenals/Urinary Tract: No masses identified. No evidence of hydronephrosis. Stomach/Bowel: No evidence of obstruction, inflammatory process, or abnormal fluid collections. Vascular/Lymphatic: No pathologically enlarged lymph nodes. No evidence of abdominal aortic aneurysm. Reproductive: There is a mostly exophytic 5 cm left fundal myometrial mass, likely representing a fibroid. A smaller intramural 18 mm myometrial mass is also seen within the left body of the uterus. Fallopian tube occlusion devices are seen. Other: Small amount of free fluid in the pelvis. Musculoskeletal:  No suspicious bone lesions identified. IMPRESSION: No acute findings within the abdomen. Myometrial masses, including a 5 cm mostly exophytic left fundal subserosal mass, likely representing fibroids. Further evaluation with nonemergent pelvic ultrasound may be considered. Electronically Signed   By: Fidela Salisbury M.D.   On: 07/22/2015 15:16         Subjective: Patient continues to have dry heaving without hematemesis. She had 3-4 loose bowel movements without hematochezia. There is no hematemesis. Complains of burning in her chest without any shortness of breath. Denies any dysuria, hematuria. Complains of crampy abdominal pain.  Objective: Filed Vitals:   07/24/15 1423 07/24/15 2041 07/25/15 0503 07/25/15 1303  BP: 190/110 152/80 153/90 178/97  Pulse: 123 133 105 117  Temp: 97.8 F (36.6 C) 98.5 F (36.9 C) 98.3 F (36.8 C) 98.1 F (36.7 C)  TempSrc: Oral Oral Oral Oral  Resp: 22 16 14 16   Height:      Weight:      SpO2: 100% 95% 98% 100%    Intake/Output Summary (Last 24 hours) at 07/25/15 1447 Last data filed at 07/25/15 0844  Gross per 24 hour  Intake 1773.33 ml  Output      0 ml  Net 1773.33 ml   Weight change:  Exam:   General:  Pt is alert,  follows commands appropriately, not in acute distress  HEENT: No icterus, No thrush, No neck mass, [place]/AT  Cardiovascular: RRR, S1/S2, no rubs, no gallops  Respiratory: CTA bilaterally, no wheezing, no crackles, no rhonchi  Abdomen: Soft/+BS, non tender, non distended, no guarding  Extremities: trace LE edema, No lymphangitis, No petechiae, No rashes, no synovitis  Data Reviewed: Basic Metabolic Panel:  Recent Labs Lab 07/20/15 0807 07/21/15 0440 07/22/15 1044 07/25/15 0411  NA 138 139 133* 134*  K 2.8* 3.6 3.7 3.3*  CL 107 107 101 101  CO2 25 22 23 24   GLUCOSE 117* 122* 121* 87  BUN <5* <5* 6 <5*  CREATININE 0.70 0.77 0.66 0.59  CALCIUM 8.1* 8.8* 9.0 8.6*   Liver Function Tests:  Recent Labs Lab 07/20/15 0807 07/21/15 0440 07/22/15 1044  AST 18 19 20   ALT 12* 13* 9*  ALKPHOS 78 92 87  BILITOT 0.4 0.5 0.6  PROT 6.0* 6.9 7.9  ALBUMIN 2.8* 3.4* 3.6   No results for input(s): LIPASE, AMYLASE in the last 168 hours. No results for input(s): AMMONIA in the last 168 hours. CBC:  Recent Labs Lab 07/20/15 0807 07/21/15 0658 07/22/15 1509  WBC 4.7 8.2 9.9  NEUTROABS  --  6.7  --   HGB 10.6* 12.7 12.4  HCT 32.8* 37.6 38.7  MCV 77.9* 77.4* 77.7*  PLT 317 377 435*   Cardiac Enzymes:  Recent Labs Lab 07/22/15 0407 07/22/15 0940 07/22/15 1509  TROPONINI 0.03 0.07* 0.06*   BNP: Invalid input(s): POCBNP CBG:  Recent Labs Lab 07/23/15 0448  GLUCAP  87    Recent Results (from the past 240 hour(s))  C difficile quick scan w PCR reflex     Status: None   Collection Time: 07/24/15 10:30 AM  Result Value Ref Range Status   C Diff antigen NEGATIVE NEGATIVE Final   C Diff toxin NEGATIVE NEGATIVE Final   C Diff interpretation Negative for toxigenic C. difficile  Final     Scheduled Meds: . amLODipine  10 mg Oral Daily  . enoxaparin (LOVENOX) injection  40 mg Subcutaneous Q24H  . famotidine  20 mg Oral BID  . lisinopril  40 mg Oral Daily  .  metoCLOPramide (REGLAN) injection  10 mg Intravenous 4 times per day   Continuous Infusions: . 0.9 % NaCl with KCl 20 mEq / L 125 mL/hr at 07/25/15 1315     Rankin Coolman, DO  [facility] Pager [phone]  If 7PM-7AM, please contact night-coverage [email or web address] Password [email or web address] 07/25/2015, 2:47 PM   LOS: 5 days

## 2015-07-26 ENCOUNTER — Encounter (HOSPITAL_COMMUNITY): Payer: Self-pay

## 2015-07-26 DIAGNOSIS — R1084 Generalized abdominal pain: Secondary | ICD-10-CM

## 2015-07-26 DIAGNOSIS — R109 Unspecified abdominal pain: Secondary | ICD-10-CM | POA: Insufficient documentation

## 2015-07-26 LAB — COMPREHENSIVE METABOLIC PANEL
ALT: 7 U/L — AB (ref 14–54)
AST: 13 U/L — ABNORMAL LOW (ref 15–41)
Albumin: 2.7 g/dL — ABNORMAL LOW (ref 3.5–5.0)
Alkaline Phosphatase: 65 U/L (ref 38–126)
Anion gap: 8 (ref 5–15)
BILIRUBIN TOTAL: 0.8 mg/dL (ref 0.3–1.2)
BUN: 7 mg/dL (ref 6–20)
CHLORIDE: 105 mmol/L (ref 101–111)
CO2: 23 mmol/L (ref 22–32)
CREATININE: 0.67 mg/dL (ref 0.44–1.00)
Calcium: 8.7 mg/dL — ABNORMAL LOW (ref 8.9–10.3)
Glucose, Bld: 68 mg/dL (ref 65–99)
POTASSIUM: 3.8 mmol/L (ref 3.5–5.1)
Sodium: 136 mmol/L (ref 135–145)
TOTAL PROTEIN: 5.8 g/dL — AB (ref 6.5–8.1)

## 2015-07-26 LAB — CBC WITH DIFFERENTIAL/PLATELET
BASOS PCT: 0 %
Basophils Absolute: 0 10*3/uL (ref 0.0–0.1)
EOS PCT: 1 %
Eosinophils Absolute: 0.1 10*3/uL (ref 0.0–0.7)
HCT: 37.5 % (ref 36.0–46.0)
Hemoglobin: 12.4 g/dL (ref 12.0–15.0)
LYMPHS ABS: 2.5 10*3/uL (ref 0.7–4.0)
Lymphocytes Relative: 27 %
MCH: 25.5 pg — AB (ref 26.0–34.0)
MCHC: 33.1 g/dL (ref 30.0–36.0)
MCV: 77 fL — AB (ref 78.0–100.0)
MONO ABS: 0.9 10*3/uL (ref 0.1–1.0)
Monocytes Relative: 10 %
Neutro Abs: 5.8 10*3/uL (ref 1.7–7.7)
Neutrophils Relative %: 62 %
PLATELETS: 342 10*3/uL (ref 150–400)
RBC: 4.87 MIL/uL (ref 3.87–5.11)
RDW: 15 % (ref 11.5–15.5)
WBC: 9.3 10*3/uL (ref 4.0–10.5)

## 2015-07-26 LAB — MAGNESIUM: MAGNESIUM: 2 mg/dL (ref 1.7–2.4)

## 2015-07-26 MED ORDER — METOPROLOL TARTRATE 1 MG/ML IV SOLN: 5.0000 mg | Freq: Four times a day (QID) | INTRAVENOUS | Status: DC | PRN

## 2015-07-26 NOTE — Discharge Summary (Signed)
Physician Discharge Summary  Abigail Burgess [id number] DOB: 1971/06/25 DOA: 07/20/2015  PCP: Glenda Chroman., MD  Admit date: 07/20/2015 Discharge date: 07/27/15 Recommendations for Outpatient Follow-up:  1. Pt will need to follow up with PCP in 1-2 weeks post discharge    Discharge Diagnoses:  Acute gastroenteritis -Possibly associated with acute viral gastroenteritis--pt works at daycare -since pt continues to have N/V/D-->slow improvement -07/26/2015--no vomiting or diarrhea for the past 24 hours -Advanced to full  Liquids initially and pt continued to have N/V -schedule IV zofran and made NPO --GI consult appreciated given persistent symptoms. Recommend supportive care.  -After 6 days of hospitalization, she began to improve clinically and her diet was advanced which she toleraed -stool lactoferrin--no bowel movement to collect as pt improved clinically -C. difficile negative--no bowel movement to collect as pt improved clinically -Continue aggressive IV hydration -Continue Pepcid and Reglan -Lactic acid normal..  -CT of the abdomen done to check abdominal pain and was negative. -lactic acid 1.4 -UA--negative for pyuria -UDS--reveals barbiturates and opiates  Sinus Tachycardia -Associated with severe dehydration. Asymptomatic.  -2-D echo done with EF of 55-60% and grade 1 diastolic dysfunction. No valvular abnormality noted. -TSH 1.440 -d/c hydralazine as this may be contributing to tachycardia  Elevated troponin Complained of substernal pain on 11/19. This is likely secondary to her retching and vomiting and tachycardia.  -EKG unremarkable.  -echo unremarkable--EF [id number], grade 1 diastolic dysfunction, no WMA -No cardiac symptoms.  Hypokalemia  -Replenished. -check mag--2.0  Trichomonas vaginitis Patient reports having multiple partners over the past 2 years. Denies any urinary symptoms. Reports having a single partner for the past 1 year . Will treat  her with a single dose of tinidazole 2 gm upon discharge.. Have instructed to have a partner checked for trichomonas, STD and HIV.  Essential hypertension  Resumed lisinopril. Blood pressure elevated possibly associated with her current symptoms. Added amlodipine and on when necessary hydralazine. Holding HCTZ given severe hypokalemia and volume depletion.   low back pain Reports having symptoms as outpatient but worsening for the past 2 days. Localized tenderness in the lumbar area. Pain is nonradiating. X-ray of lumbar spine shows spondylosis. Stable on when necessary Tylenol.  Discharge Condition: stable  Disposition: home Follow-up Information    Follow up with Abigail B., MD. Schedule an appointment as soon as possible for a visit in 2 weeks.   Specialty:  Internal Medicine   Contact information:   [place] [address] [phone]       Diet:soft Wt Readings from Last 3 Encounters:  07/20/15 89.086 kg (196 lb 6.4 oz)    History of present illness:  44 year old female with history of hypertension, hyperlipidemia presented at [facility] after having more than 10 episodes of vomiting with watery stool on the morning of 11/17. She reported having a meal at [employer] on the prior evening. She was afebrile with workup done at [facility] showing metabolic acidosis with anion gap of 17 and UA showing bacteria with trichomonas. She was not improved with several rounds of antiemetics in the ED and was thus transferred to [facility] as per patient request. Unfortunately, the patient's clinical situation was slow to improve. She continued to have vomiting and diarrhea. C. difficile toxin was negative. Gastroenterology was consulted and they felt that conservative management was in the patient's best interest. The patient was continued on intravenous fluids. Unfortunately, she continued to have vomiting and diarrhea as her diet was advanced. Ultimately, the decision  was  made to make the patient completely NPO. After approximately 36 hours, the patient gradually improved. Her diet was advanced slowly to a soft diet which she tolerated. It was thought that part of the patient's continued vomiting and slow clinical improvement was due to her extensive use of opioids. Urinalysis was negative for pyuria. Urine drug screen showed barbiturates and opiates. Stool lactoferrin and stool pathogen panel was ordered when the patient did not improve. However she did improve prior to collection of these tests.  Consultants: [facility]  Discharge Exam: Filed Vitals:   07/26/15 1443 07/26/15 1443  BP:  153/86  Pulse:  108  Temp: 98.9 F (37.2 C)   Resp:  16   Filed Vitals:   07/25/15 2100 07/26/15 0550 07/26/15 1443 07/26/15 1443  BP: 138/64 132/70  153/86  Pulse: 134 103  108  Temp: 98.4 F (36.9 C) 98.2 F (36.8 C) 98.9 F (37.2 C)   TempSrc: Oral Oral Oral   Resp: 18 18  16   Height:      Weight:      SpO2: 100% 98%  99%   General: A&O x 3, NAD, pleasant, cooperative Cardiovascular: RRR, no rub, no gallop, no S3 Respiratory: CTAB, no wheeze, no rhonchi Abdomen:soft, nontender, nondistended, positive bowel sounds Extremities: No edema, No lymphangitis, no petechiae  Discharge Instructions     Medication List    TAKE these medications        hydrochlorothiazide 25 MG tablet  Commonly known as:  HYDRODIURIL  Take 1 tablet (25 mg total) by mouth daily.     tinidazole 500 MG tablet  Commonly known as:  TINDAMAX  Take 4 tablets (2,000 mg total) by mouth once.      ASK your doctor about these medications        ibuprofen 200 MG tablet  Commonly known as:  ADVIL,MOTRIN  Take 400 mg by mouth every 6 (six) hours as needed for headache or mild pain.     lisinopril 40 MG tablet  Commonly known as:  PRINIVIL,ZESTRIL  Take 40 mg by mouth daily.  Ask about: Which instructions should I use?     lisinopril 40 MG tablet  Commonly known as:   PRINIVIL,ZESTRIL  Take 1 tablet (40 mg total) by mouth daily.  Ask about: Which instructions should I use?         The results of significant diagnostics from this hospitalization (including imaging, microbiology, ancillary and laboratory) are listed below for reference.    Significant Diagnostic Studies: Dg Lumbar Spine 2-3 Views  07/23/2015  CLINICAL DATA:  Upper to mid lumbar spine pain. Recent vomiting. No known injury. EXAM: LUMBAR SPINE - 2-3 VIEW COMPARISON:  CT abdomen and pelvis 07/22/2015. Lumbar spine radiographs 10/05/2012. FINDINGS: There are 5 non rib-bearing lumbar type vertebral bodies. Vertebral alignment is normal. Mild endplate osteophytosis in the lumbar and visualized lower thoracic spine is similar to minimally more prominent than on the prior radiographs, greatest at L3-4. Intervertebral disc space heights remain preserved. No destructive osseous lesion is seen. IMPRESSION: Mild lumbar spondylosis. Electronically Signed   By: Logan Bores M.D.   On: 07/23/2015 09:21   Ct Abdomen Pelvis W Contrast  07/22/2015  CLINICAL DATA:  Pain in the umbilical area, diarrhea, nausea and vomiting. EXAM: CT ABDOMEN AND PELVIS WITH CONTRAST TECHNIQUE: Multidetector CT imaging of the abdomen and pelvis was performed using the standard protocol following bolus administration of intravenous contrast. CONTRAST:  138mL OMNIPAQUE IOHEXOL 300 MG/ML  SOLN COMPARISON:  None. FINDINGS: Lower chest:  No acute findings. Hepatobiliary: No masses or other significant abnormality. Pancreas: No mass, inflammatory changes, or other significant abnormality. Spleen: Within normal limits in size and appearance. Adrenals/Urinary Tract: No masses identified. No evidence of hydronephrosis. Stomach/Bowel: No evidence of obstruction, inflammatory process, or abnormal fluid collections. Vascular/Lymphatic: No pathologically enlarged lymph nodes. No evidence of abdominal aortic aneurysm. Reproductive: There is a mostly  exophytic 5 cm left fundal myometrial mass, likely representing a fibroid. A smaller intramural 18 mm myometrial mass is also seen within the left body of the uterus. Fallopian tube occlusion devices are seen. Other: Small amount of free fluid in the pelvis. Musculoskeletal:  No suspicious bone lesions identified. IMPRESSION: No acute findings within the abdomen. Myometrial masses, including a 5 cm mostly exophytic left fundal subserosal mass, likely representing fibroids. Further evaluation with nonemergent pelvic ultrasound may be considered. Electronically Signed   By: Fidela Salisbury M.D.   On: 07/22/2015 15:16     Microbiology: Recent Results (from the past 240 hour(s))  C difficile quick scan w PCR reflex     Status: None   Collection Time: 07/24/15 10:30 AM  Result Value Ref Range Status   C Diff antigen NEGATIVE NEGATIVE Final   C Diff toxin NEGATIVE NEGATIVE Final   C Diff interpretation Negative for toxigenic C. difficile  Final  Urine culture     Status: None (Preliminary result)   Collection Time: 07/25/15  4:26 PM  Result Value Ref Range Status   Specimen Description URINE, CLEAN CATCH  Final   Special Requests NONE  Final   Culture TOO YOUNG TO READ  Final   Report Status PENDING  Incomplete     Labs: Basic Metabolic Panel:  Recent Labs Lab 07/20/15 0807 07/21/15 0440 07/22/15 1044 07/25/15 0411 07/26/15 0303  NA 138 139 133* 134* 136  K 2.8* 3.6 3.7 3.3* 3.8  CL 107 107 101 101 105  CO2 25 22 23 24 23   GLUCOSE 117* 122* 121* 87 68  BUN <5* <5* 6 <5* 7  CREATININE 0.70 0.77 0.66 0.59 0.67  CALCIUM 8.1* 8.8* 9.0 8.6* 8.7*  MG  --   --   --   --  2.0   Liver Function Tests:  Recent Labs Lab 07/20/15 0807 07/21/15 0440 07/22/15 1044 07/26/15 0303  AST 18 19 20  13*  ALT 12* 13* 9* 7*  ALKPHOS 78 92 87 65  BILITOT 0.4 0.5 0.6 0.8  PROT 6.0* 6.9 7.9 5.8*  ALBUMIN 2.8* 3.4* 3.6 2.7*   No results for input(s): LIPASE, AMYLASE in the last 168  hours. No results for input(s): AMMONIA in the last 168 hours. CBC:  Recent Labs Lab 07/20/15 0807 07/21/15 0658 07/22/15 1509 07/26/15 0303  WBC 4.7 8.2 9.9 9.3  NEUTROABS  --  6.7  --  5.8  HGB 10.6* 12.7 12.4 12.4  HCT 32.8* 37.6 38.7 37.5  MCV 77.9* 77.4* 77.7* 77.0*  PLT 317 377 435* 342   Cardiac Enzymes:  Recent Labs Lab 07/22/15 0407 07/22/15 0940 07/22/15 1509  TROPONINI 0.03 0.07* 0.06*   BNP: Invalid input(s): POCBNP CBG:  Recent Labs Lab 07/23/15 0448  GLUCAP 87    Time coordinating discharge:  Greater than 30 minutes  Signed:  Mikia Delaluz, DO [facility] Pager: [phone] 07/26/2015, 5:39 PM

## 2015-07-26 NOTE — Progress Notes (Signed)
PROGRESS NOTE  NATISHIA GELBER [id number] DOB: Mar 05, 1971 DOA: 07/20/2015 PCP: Glenda Chroman., MD   Brief narrative 44 year old female with history of hypertension, hyperlipidemia presented at [facility] after having more than 10 episodes of vomiting with watery stool on the morning of 11/17. reported having a meal at [employer] on the prior evening. She was afebrile with workup done at [facility] showing metabolic acidosis with anion gap of 17 and UA showing bacteria with trichomonas. She was not improved with several rounds of antiemetics in the ED and was thus transferred to [facility] as per patient request. Hospital coarse prolonged due to ongoing symptoms. Assessment/Plan: Acute gastroenteritis -Possibly associated with acute viral gastroenteritis--pt works at daycare -since pt continues to have N/V/D-->stool pathogen panel -07/26/2015--no vomiting or diarrhea for the past 24 hours -Advanced to clear liquids -schedule IV zofran -stool lactoferrin--no bowel movement to collect -C. difficile negative--no bowel movement to collect -Continue aggressive IV hydration -Continue Pepcid and Reglan -Lactic acid normal..  -CT of the abdomen done to check abdominal pain and was negative. Marland Kitchen BMET normal. -GI consult appreciated given persistent symptoms. Recommend supportive care.  -lactic acid 1.4 -UA--negative for pyuria -UDS--reveals barbiturates and opiates  Sinus Tachycardia -Associated with severe dehydration. Asymptomatic.  -2-D echo done with EF of 55-60% and grade 1 diastolic dysfunction. No valvular abnormality noted. -TSH 1.440 -d/c hydralazine as this may be contributing to tachycardia  Elevated troponin Complained of substernal pain on 11/19. This is likely secondary to her retching and vomiting and tachycardia.  -EKG unremarkable.  -echo unremarkable--EF [id number], grade 1 diastolic dysfunction, no WMA -No cardiac symptoms.  Hypokalemia    -Replenished. -check mag--2.0  Trichomonas vaginitis Patient reports having multiple partners over the past 2 years. Denies any urinary symptoms. Reports having a single partner for the past 1 year . Will treat her with a single dose of metronidazole 2 gm upon discharge.. Have instructed to have a partner checked for trichomonas, STD and HIV.  Essential hypertension  Resumed lisinopril. Blood pressure elevated possibly associated with her current symptoms. Added amlodipine and on when necessary hydralazine. Holding HCTZ given severe hypokalemia and volume depletion.   low back pain Reports having symptoms as outpatient but worsening for the past 2 days. Localized tenderness in the lumbar area. Pain is nonradiating. X-ray of lumbar spine shows spondylosis. Stable on when necessary Tylenol.  Family communication: None at bedside Disposition: Home in 2-3 days       Procedures/Studies: Dg Lumbar Spine 2-3 Views  07/23/2015  CLINICAL DATA:  Upper to mid lumbar spine pain. Recent vomiting. No known injury. EXAM: LUMBAR SPINE - 2-3 VIEW COMPARISON:  CT abdomen and pelvis 07/22/2015. Lumbar spine radiographs 10/05/2012. FINDINGS: There are 5 non rib-bearing lumbar type vertebral bodies. Vertebral alignment is normal. Mild endplate osteophytosis in the lumbar and visualized lower thoracic spine is similar to minimally more prominent than on the prior radiographs, greatest at L3-4. Intervertebral disc space heights remain preserved. No destructive osseous lesion is seen. IMPRESSION: Mild lumbar spondylosis. Electronically Signed   By: Logan Bores M.D.   On: 07/23/2015 09:21   Ct Abdomen Pelvis W Contrast  07/22/2015  CLINICAL DATA:  Pain in the umbilical area, diarrhea, nausea and vomiting. EXAM: CT ABDOMEN AND PELVIS WITH CONTRAST TECHNIQUE: Multidetector CT imaging of the abdomen and pelvis was performed using the standard protocol following bolus administration of intravenous contrast.  CONTRAST:  165mL OMNIPAQUE IOHEXOL 300 MG/ML  SOLN  COMPARISON:  None. FINDINGS: Lower chest:  No acute findings. Hepatobiliary: No masses or other significant abnormality. Pancreas: No mass, inflammatory changes, or other significant abnormality. Spleen: Within normal limits in size and appearance. Adrenals/Urinary Tract: No masses identified. No evidence of hydronephrosis. Stomach/Bowel: No evidence of obstruction, inflammatory process, or abnormal fluid collections. Vascular/Lymphatic: No pathologically enlarged lymph nodes. No evidence of abdominal aortic aneurysm. Reproductive: There is a mostly exophytic 5 cm left fundal myometrial mass, likely representing a fibroid. A smaller intramural 18 mm myometrial mass is also seen within the left body of the uterus. Fallopian tube occlusion devices are seen. Other: Small amount of free fluid in the pelvis. Musculoskeletal:  No suspicious bone lesions identified. IMPRESSION: No acute findings within the abdomen. Myometrial masses, including a 5 cm mostly exophytic left fundal subserosal mass, likely representing fibroids. Further evaluation with nonemergent pelvic ultrasound may be considered. Electronically Signed   By: Fidela Salisbury M.D.   On: 07/22/2015 15:16         Subjective: Patient is feeling better today. No vomiting or diarrhea for the past 4 hours. Denies fevers, chills, chest pain, short of breath, nausea, dysuria, hematuria. No hematochezia or melena. Denies any rashes.  Objective: Filed Vitals:   07/25/15 2100 07/26/15 0550 07/26/15 1443 07/26/15 1443  BP: 138/64 132/70  153/86  Pulse: 134 103  108  Temp: 98.4 F (36.9 C) 98.2 F (36.8 C) 98.9 F (37.2 C)   TempSrc: Oral Oral Oral   Resp: 18 18  16   Height:      Weight:      SpO2: 100% 98%  99%    Intake/Output Summary (Last 24 hours) at 07/26/15 1454 Last data filed at 07/26/15 1445  Gross per 24 hour  Intake 3729.17 ml  Output   2600 ml  Net 1129.17 ml   Weight  change:  Exam:   General:  Pt is alert, follows commands appropriately, not in acute distress  HEENT: No icterus, No thrush, No neck mass, [place]/AT  Cardiovascular: RRR, S1/S2, no rubs, no gallops  Respiratory: CTA bilaterally, no wheezing, no crackles, no rhonchi  Abdomen: Soft/+BS, non tender, non distended, no guarding; no hepatosplenomegaly  Extremities: No edema, No lymphangitis, No petechiae, No rashes, no synovitis; no cyanosis or clubbing   Data Reviewed: Basic Metabolic Panel:  Recent Labs Lab 07/20/15 0807 07/21/15 0440 07/22/15 1044 07/25/15 0411 07/26/15 0303  NA 138 139 133* 134* 136  K 2.8* 3.6 3.7 3.3* 3.8  CL 107 107 101 101 105  CO2 25 22 23 24 23   GLUCOSE 117* 122* 121* 87 68  BUN <5* <5* 6 <5* 7  CREATININE 0.70 0.77 0.66 0.59 0.67  CALCIUM 8.1* 8.8* 9.0 8.6* 8.7*  MG  --   --   --   --  2.0   Liver Function Tests:  Recent Labs Lab 07/20/15 0807 07/21/15 0440 07/22/15 1044 07/26/15 0303  AST 18 19 20  13*  ALT 12* 13* 9* 7*  ALKPHOS 78 92 87 65  BILITOT 0.4 0.5 0.6 0.8  PROT 6.0* 6.9 7.9 5.8*  ALBUMIN 2.8* 3.4* 3.6 2.7*   No results for input(s): LIPASE, AMYLASE in the last 168 hours. No results for input(s): AMMONIA in the last 168 hours. CBC:  Recent Labs Lab 07/20/15 0807 07/21/15 0658 07/22/15 1509 07/26/15 0303  WBC 4.7 8.2 9.9 9.3  NEUTROABS  --  6.7  --  5.8  HGB 10.6* 12.7 12.4 12.4  HCT 32.8* 37.6 38.7 37.5  MCV 77.9* 77.4* 77.7* 77.0*  PLT 317 377 435* 342   Cardiac Enzymes:  Recent Labs Lab 07/22/15 0407 07/22/15 0940 07/22/15 1509  TROPONINI 0.03 0.07* 0.06*   BNP: Invalid input(s): POCBNP CBG:  Recent Labs Lab 07/23/15 0448  GLUCAP 87    Recent Results (from the past 240 hour(s))  C difficile quick scan w PCR reflex     Status: None   Collection Time: 07/24/15 10:30 AM  Result Value Ref Range Status   C Diff antigen NEGATIVE NEGATIVE Final   C Diff toxin NEGATIVE NEGATIVE Final   C Diff  interpretation Negative for toxigenic C. difficile  Final  Urine culture     Status: None (Preliminary result)   Collection Time: 07/25/15  4:26 PM  Result Value Ref Range Status   Specimen Description URINE, CLEAN CATCH  Final   Special Requests NONE  Final   Culture TOO YOUNG TO READ  Final   Report Status PENDING  Incomplete     Scheduled Meds: . amLODipine  10 mg Oral Daily  . enoxaparin (LOVENOX) injection  40 mg Subcutaneous Q24H  . famotidine  20 mg Oral BID  . lisinopril  40 mg Oral Daily  . metoCLOPramide (REGLAN) injection  10 mg Intravenous 4 times per day  . ondansetron (ZOFRAN) IV  4 mg Intravenous 4 times per day   Continuous Infusions: . 0.9 % NaCl with KCl 20 mEq / L 125 mL/hr at 07/26/15 0905     Barbarann Kelly, DO  [facility] Pager [phone]  If 7PM-7AM, please contact night-coverage [email or web address] Password [email or web address] 07/26/2015, 2:54 PM   LOS: 6 days

## 2015-07-27 LAB — BASIC METABOLIC PANEL
ANION GAP: 6 (ref 5–15)
BUN: <5 mg/dL — ABNORMAL LOW (ref 6–20)
CALCIUM: 8.6 mg/dL — AB (ref 8.9–10.3)
CO2: 26 mmol/L (ref 22–32)
Chloride: 105 mmol/L (ref 101–111)
Creatinine, Ser: 0.62 mg/dL (ref 0.44–1.00)
Glucose, Bld: 75 mg/dL (ref 65–99)
POTASSIUM: 3.7 mmol/L (ref 3.5–5.1)
Sodium: 137 mmol/L (ref 135–145)

## 2015-07-27 LAB — URINE CULTURE

## 2015-07-27 MED ORDER — AMLODIPINE BESYLATE 10 MG PO TABS: 10.0000 mg | ORAL_TABLET | Freq: Every day | ORAL | Status: AC

## 2015-07-27 NOTE — Care Management Note (Signed)
Case Management Note  Patient Details  Name: Abigail Burgess MRN: [id number] Date of Birth: 09-22-1970  Subjective/Objective:    Patient for dc today, per MD patient has no needs.                Action/Plan:   Expected Discharge Date:                  Expected Discharge Plan:  Home/Self Care  In-House Referral:     Discharge planning Services  CM Consult  Post Acute Care Choice:    Choice offered to:     DME Arranged:    DME Agency:     HH Arranged:    [place] Agency:     Status of Service:  Completed, signed off  Medicare Important Message Given:    Date Medicare IM Given:    Medicare IM give by:    Date Additional Medicare IM Given:    Additional Medicare Important Message give by:     If discussed at [place] of Stay Meetings, dates discussed:    Additional Comments:  Zenon Mayo, RN 07/27/2015, 3:22 PM

## 2016-08-23 IMAGING — CT CT ABD-PELV W/ CM
2 of 5 series · 12 of 46 positions shown, 14 images · IV contrast (Iodine)
Comparison: None.

CLINICAL DATA: Pain in the umbilical area, diarrhea, nausea and
vomiting.

EXAM:
CT ABDOMEN AND PELVIS WITH CONTRAST
TECHNIQUE: Multidetector CT imaging of the abdomen and pelvis was performed
using the standard protocol following bolus administration of
intravenous contrast.
CONTRAST:  100mL OMNIPAQUE IOHEXOL 300 MG/ML  SOLN

[Series 201: routine, idose (2) · axial · 0.64mm/px · z∈[-876,-501]mm · 9 of 90 slices shown, 11 images]
[im 10/90  soft-tissue]
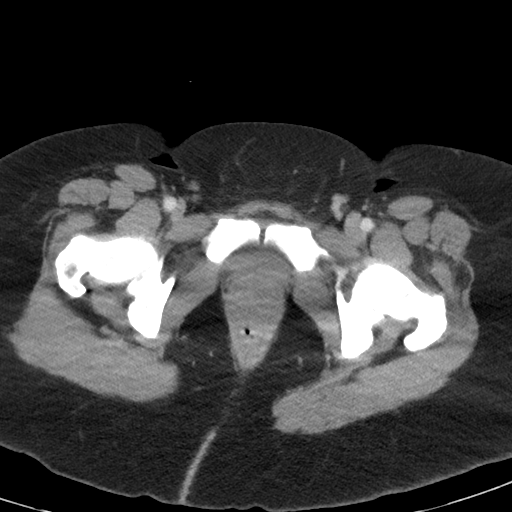
[im 10/90  bone]
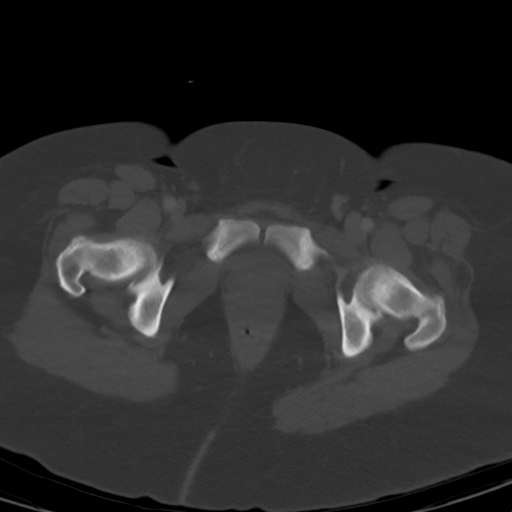
[im 19/90  soft-tissue]
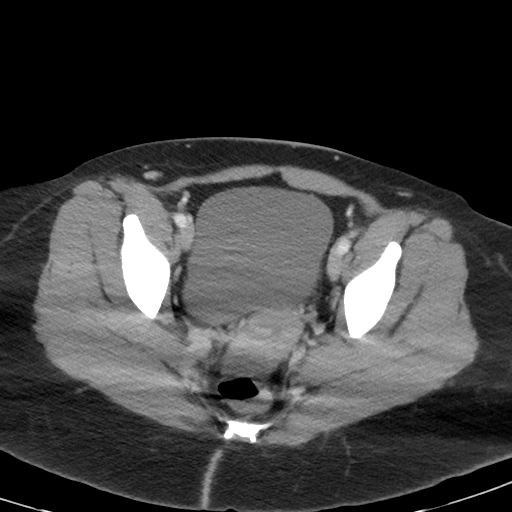
[im 29/90  soft-tissue]
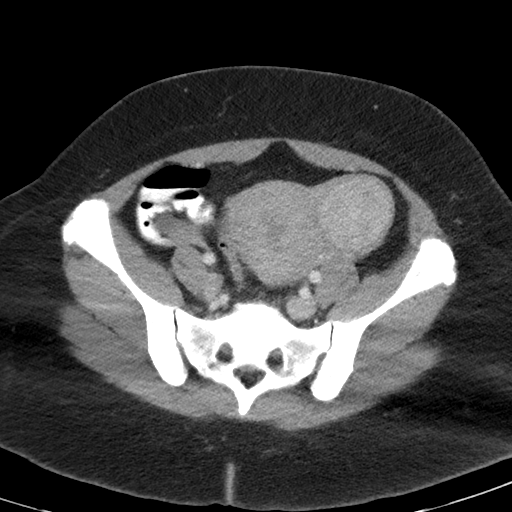
[im 38/90  soft-tissue]
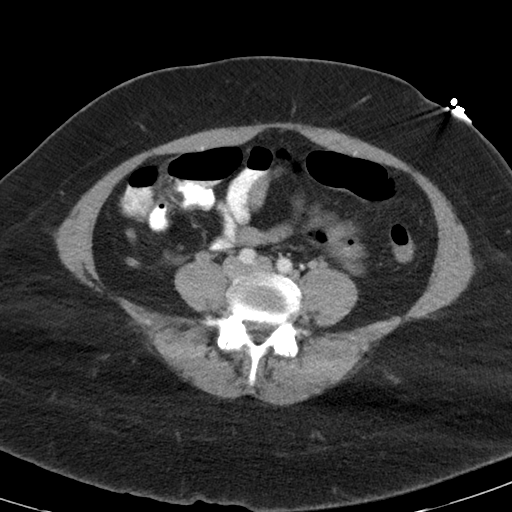
[im 47/90  soft-tissue]
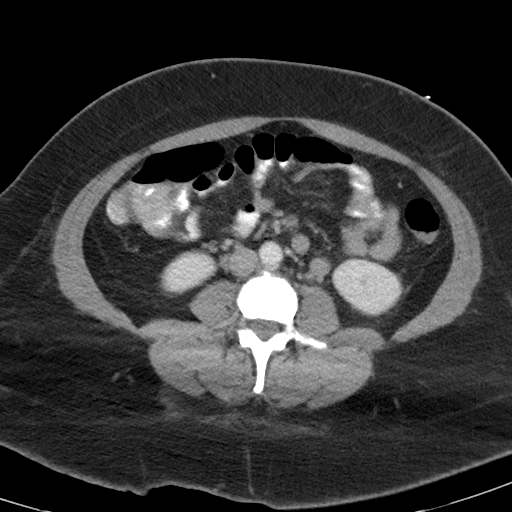
[im 57/90  soft-tissue]
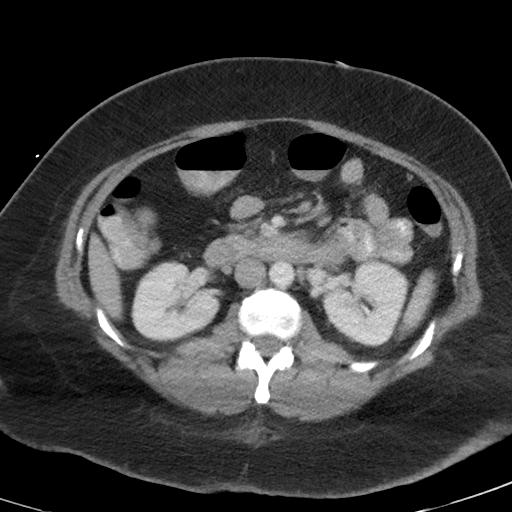
[im 66/90  soft-tissue]
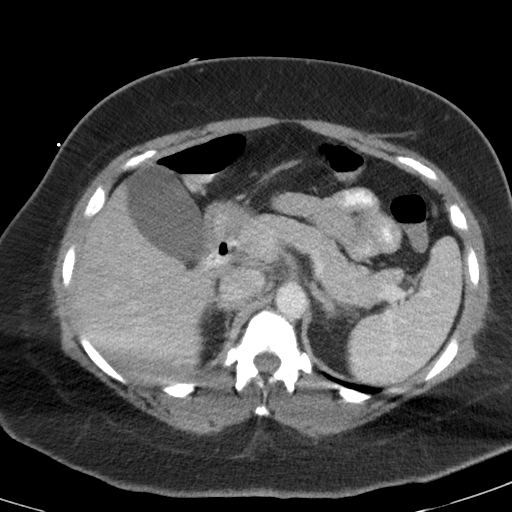
[im 75/90  soft-tissue]
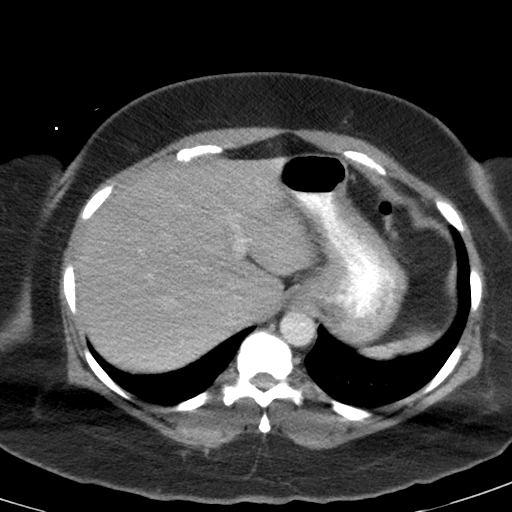
[im 85/90  soft-tissue]
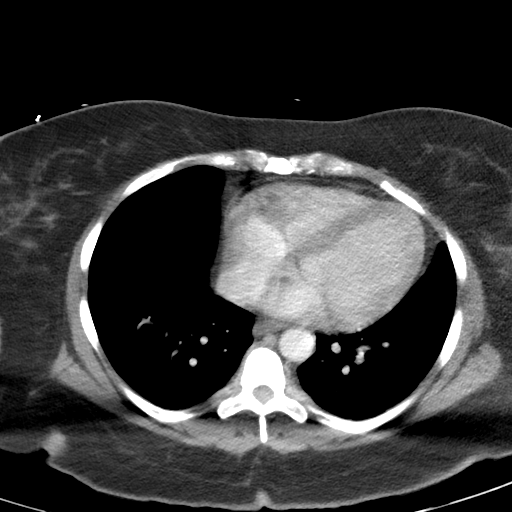
[im 85/90  bone]
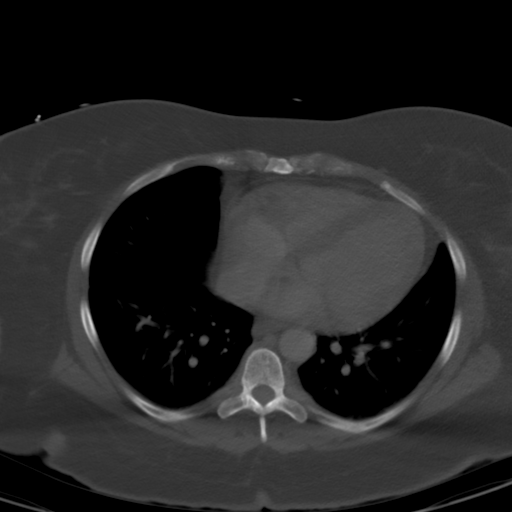

[Series 203: coronals, idose (2) · coronal · 0.45mm/px · 3 of 96 slices shown]
[im 32/96  soft-tissue]
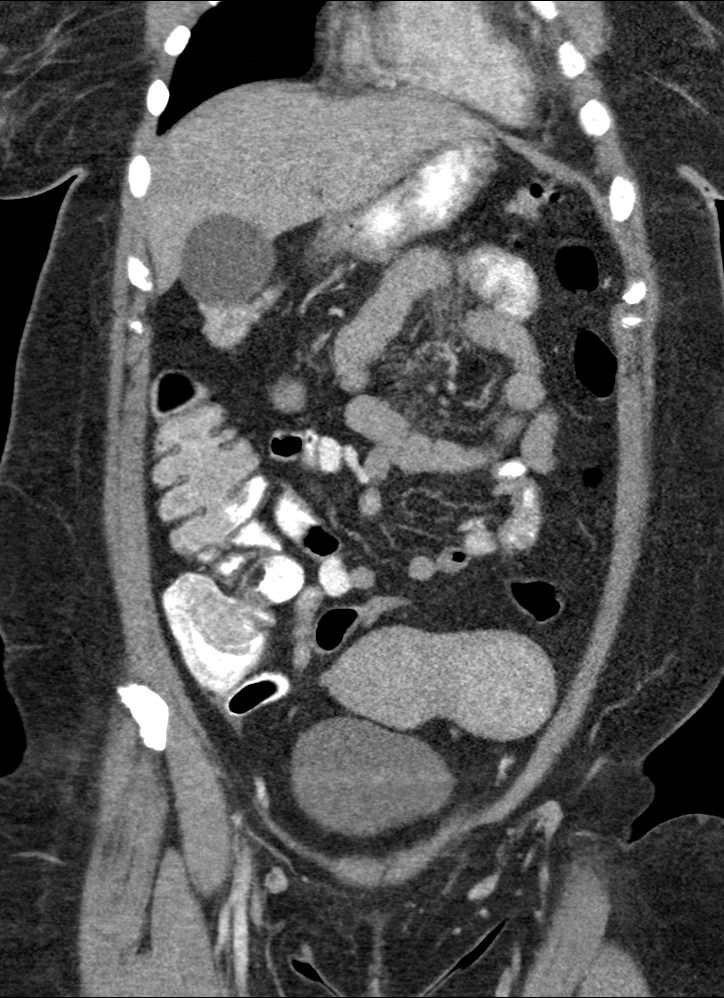
[im 43/96  soft-tissue]
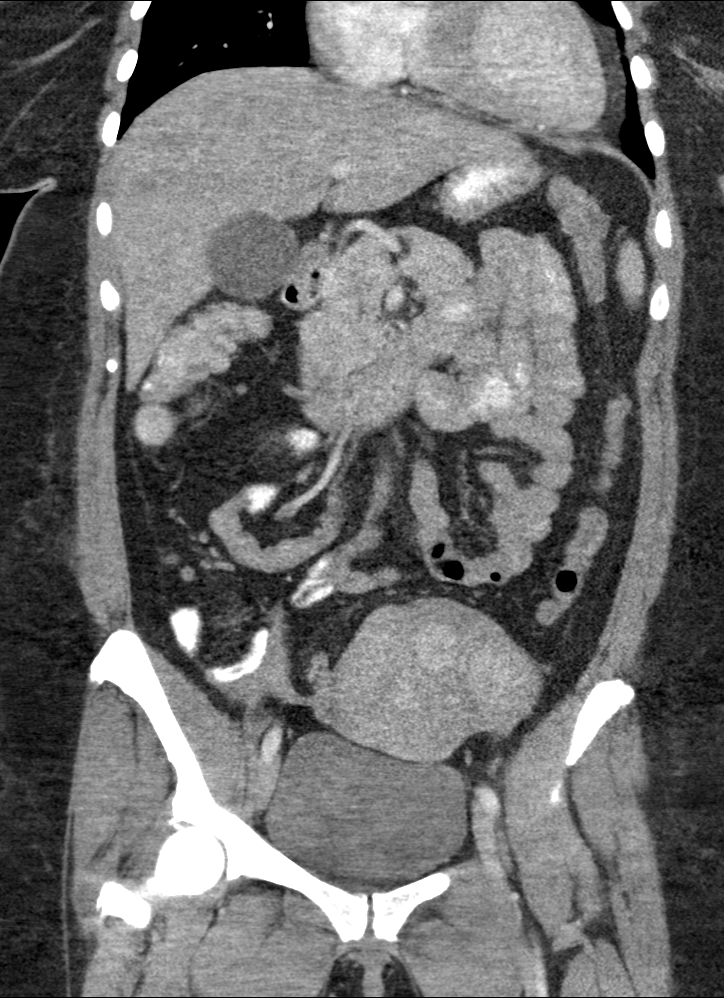
[im 53/96  soft-tissue]
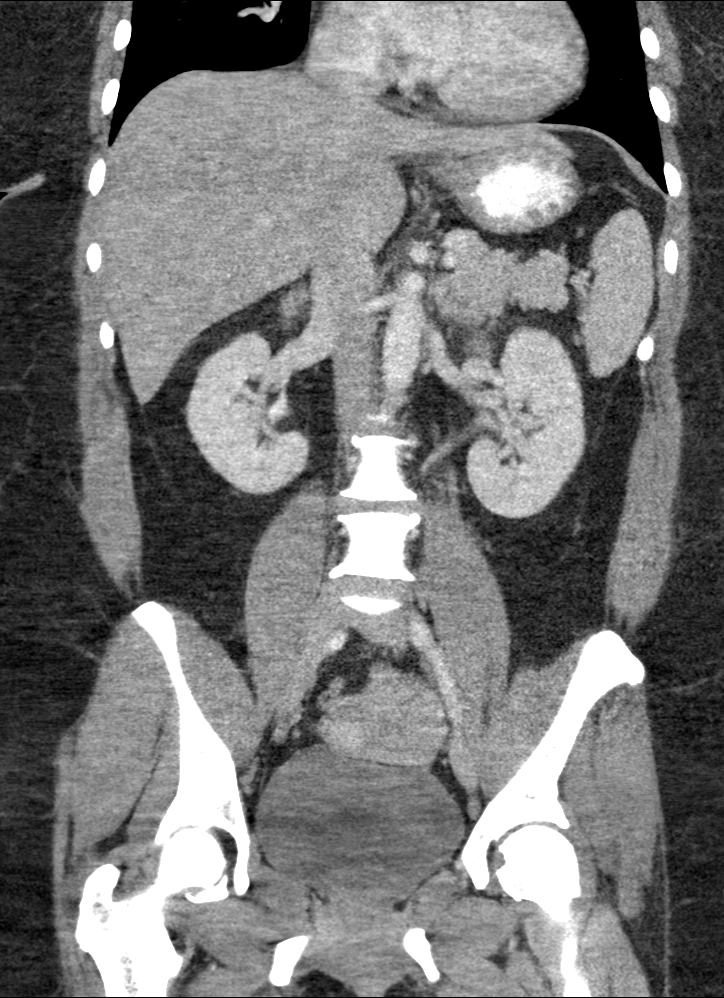

[12 of 46 positions shown; findings below may reference images not displayed]

FINDINGS: Lower chest:  No acute findings.

Hepatobiliary: No masses or other significant abnormality.

Pancreas: No mass, inflammatory changes, or other significant
abnormality.

Spleen: Within normal limits in size and appearance.

Adrenals/Urinary Tract: No masses identified. No evidence of
hydronephrosis.

Stomach/Bowel: No evidence of obstruction, inflammatory process, or
abnormal fluid collections.

Vascular/Lymphatic: No pathologically enlarged lymph nodes. No
evidence of abdominal aortic aneurysm.

Reproductive: There is a mostly exophytic 5 cm left fundal
myometrial mass, likely representing a fibroid. A smaller intramural
18 mm myometrial mass is also seen within the left body of the
uterus. Fallopian tube occlusion devices are seen.

Other: Small amount of free fluid in the pelvis.

Musculoskeletal:  No suspicious bone lesions identified.
IMPRESSION: No acute findings within the abdomen.

Myometrial masses, including a 5 cm mostly exophytic left fundal
subserosal mass, likely representing fibroids. Further evaluation
with nonemergent pelvic ultrasound may be considered.
